# Patient Record
Sex: Female | Born: 1975 | Race: White | Hispanic: Yes | Marital: Married | Smoking: Current every day smoker
Health system: Southern US, Community
[De-identification: ages and names within clinical notes are randomized; demographics above are authoritative.]

## PROBLEM LIST (undated history)

## (undated) DIAGNOSIS — E785 Hyperlipidemia, unspecified: Secondary | ICD-10-CM

## (undated) DIAGNOSIS — F32A Depression, unspecified: Secondary | ICD-10-CM

## (undated) DIAGNOSIS — F419 Anxiety disorder, unspecified: Secondary | ICD-10-CM

## (undated) DIAGNOSIS — G500 Trigeminal neuralgia: Secondary | ICD-10-CM

## (undated) DIAGNOSIS — F329 Major depressive disorder, single episode, unspecified: Secondary | ICD-10-CM

## (undated) DIAGNOSIS — I10 Essential (primary) hypertension: Secondary | ICD-10-CM

## (undated) HISTORY — DX: Essential (primary) hypertension: I10

## (undated) HISTORY — PX: ABDOMINAL ADHESION SURGERY: SHX90

## (undated) HISTORY — DX: Anxiety disorder, unspecified: F41.9

## (undated) HISTORY — PX: TONSILLECTOMY: SUR1361

## (undated) HISTORY — PX: ABDOMINAL HYSTERECTOMY: SHX81

## (undated) HISTORY — DX: Trigeminal neuralgia: G50.0

## (undated) HISTORY — DX: Depression, unspecified: F32.A

## (undated) HISTORY — DX: Hyperlipidemia, unspecified: E78.5

---
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
## 2018-11-19 DIAGNOSIS — B0223 Postherpetic polyneuropathy: Secondary | ICD-10-CM | POA: Insufficient documentation

## 2018-11-19 DIAGNOSIS — G9332 Myalgic encephalomyelitis/chronic fatigue syndrome: Secondary | ICD-10-CM | POA: Insufficient documentation

## 2019-04-16 DIAGNOSIS — F332 Major depressive disorder, recurrent severe without psychotic features: Secondary | ICD-10-CM | POA: Insufficient documentation

## 2019-04-21 DIAGNOSIS — E559 Vitamin D deficiency, unspecified: Secondary | ICD-10-CM | POA: Insufficient documentation

## 2019-09-21 DIAGNOSIS — R42 Dizziness and giddiness: Secondary | ICD-10-CM | POA: Insufficient documentation

## 2019-09-21 DIAGNOSIS — R413 Other amnesia: Secondary | ICD-10-CM | POA: Insufficient documentation

## 2019-09-21 DIAGNOSIS — R2689 Other abnormalities of gait and mobility: Secondary | ICD-10-CM | POA: Insufficient documentation

## 2019-10-02 ENCOUNTER — Other Ambulatory Visit: Payer: Self-pay | Admitting: Acute Care

## 2019-10-02 DIAGNOSIS — R9082 White matter disease, unspecified: Secondary | ICD-10-CM

## 2019-10-16 ENCOUNTER — Ambulatory Visit: Admission: RE | Admit: 2019-10-16 | Payer: 59 | Source: Ambulatory Visit

## 2019-10-17 ENCOUNTER — Other Ambulatory Visit (INDEPENDENT_AMBULATORY_CARE_PROVIDER_SITE_OTHER): Payer: Self-pay | Admitting: Vascular Surgery

## 2019-10-17 DIAGNOSIS — R0989 Other specified symptoms and signs involving the circulatory and respiratory systems: Secondary | ICD-10-CM

## 2019-10-17 DIAGNOSIS — L819 Disorder of pigmentation, unspecified: Secondary | ICD-10-CM

## 2019-10-19 ENCOUNTER — Other Ambulatory Visit: Payer: Self-pay

## 2019-10-19 ENCOUNTER — Ambulatory Visit (INDEPENDENT_AMBULATORY_CARE_PROVIDER_SITE_OTHER): Payer: 59

## 2019-10-19 ENCOUNTER — Ambulatory Visit (INDEPENDENT_AMBULATORY_CARE_PROVIDER_SITE_OTHER): Payer: 59 | Admitting: Vascular Surgery

## 2019-10-19 ENCOUNTER — Encounter (INDEPENDENT_AMBULATORY_CARE_PROVIDER_SITE_OTHER): Payer: Self-pay | Admitting: Vascular Surgery

## 2019-10-19 DIAGNOSIS — F17200 Nicotine dependence, unspecified, uncomplicated: Secondary | ICD-10-CM | POA: Diagnosis not present

## 2019-10-19 DIAGNOSIS — M79604 Pain in right leg: Secondary | ICD-10-CM | POA: Diagnosis not present

## 2019-10-19 DIAGNOSIS — M79609 Pain in unspecified limb: Secondary | ICD-10-CM | POA: Insufficient documentation

## 2019-10-19 DIAGNOSIS — M79605 Pain in left leg: Secondary | ICD-10-CM | POA: Diagnosis not present

## 2019-10-19 DIAGNOSIS — L819 Disorder of pigmentation, unspecified: Secondary | ICD-10-CM

## 2019-10-19 DIAGNOSIS — R230 Cyanosis: Secondary | ICD-10-CM | POA: Diagnosis not present

## 2019-10-19 DIAGNOSIS — R0989 Other specified symptoms and signs involving the circulatory and respiratory systems: Secondary | ICD-10-CM

## 2019-10-19 NOTE — Assessment & Plan Note (Signed)
To assess her arterial perfusion we performed noninvasive studies today in the form of ABIs which were normal at 1.22 on the right and 1.15 on the left with triphasic waveforms although the PPG toe pressures appear slightly dampened.  I will check her venous disease as well, although I do not think vascular disease is likely the cause of her discoloration at this point.

## 2019-10-19 NOTE — Assessment & Plan Note (Signed)

## 2019-10-19 NOTE — Assessment & Plan Note (Signed)
To assess her arterial perfusion we performed noninvasive studies today in the form of ABIs which were normal at 1.22 on the right and 1.15 on the left with triphasic waveforms although the PPG toe pressures appear slightly dampened.   Source for pain is not entirely clear.  I will assess her for venous disease although her symptoms are not really typical for venous disease.  She already been checked for neuropathy which was unrevealing.  I suspect some sort of autoimmune or other disorder and she may benefit from a rheumatology evaluation.  We can provide this if her venous studies are unrevealing.

## 2019-10-19 NOTE — Patient Instructions (Signed)
Peripheral Vascular Disease  Peripheral vascular disease (PVD) is a disease of the blood vessels that are not part of your heart and brain. A simple term for PVD is poor circulation. In most cases, PVD narrows the blood vessels that carry blood from your heart to the rest of your body. This can reduce the supply of blood to your arms, legs, and internal organs, like your stomach or kidneys. However, PVD most often affects a person's lower legs and feet. Without treatment, PVD tends to get worse. PVD can also lead to acute ischemic limb. This is when an arm or leg suddenly cannot get enough blood. This is a medical emergency. Follow these instructions at home: Lifestyle  Do not use any products that contain nicotine or tobacco, such as cigarettes and e-cigarettes. If you need help quitting, ask your doctor.  Lose weight if you are overweight. Or, stay at a healthy weight as told by your doctor.  Eat a diet that is low in fat and cholesterol. If you need help, ask your doctor.  Exercise regularly. Ask your doctor for activities that are right for you. General instructions  Take over-the-counter and prescription medicines only as told by your doctor.  Take good care of your feet: ? Wear comfortable shoes that fit well. ? Check your feet often for any cuts or sores.  Keep all follow-up visits as told by your doctor This is important. Contact a doctor if:  You have cramps in your legs when you walk.  You have leg pain when you are at rest.  You have coldness in a leg or foot.  Your skin changes.  You are unable to get or have an erection (erectile dysfunction).  You have cuts or sores on your feet that do not heal. Get help right away if:  Your arm or leg turns cold, numb, and blue.  Your arms or legs become red, warm, swollen, painful, or numb.  You have chest pain.  You have trouble breathing.  You suddenly have weakness in your face, arm, or leg.  You become very  confused or you cannot speak.  You suddenly have a very bad headache.  You suddenly cannot see. Summary  Peripheral vascular disease (PVD) is a disease of the blood vessels.  A simple term for PVD is poor circulation. Without treatment, PVD tends to get worse.  Treatment may include exercise, low fat and low cholesterol diet, and quitting smoking. This information is not intended to replace advice given to you by your health care provider. Make sure you discuss any questions you have with your health care provider. Document Revised: 06/03/2017 Document Reviewed: 07/29/2016 Elsevier Patient Education  2020 [employer] Inc[employer]  

## 2019-10-19 NOTE — Progress Notes (Signed)
Patient ID: Holly Howard, female   DOB: 04-23-1976, 44 y.o.   MRN: [id number]  Chief Complaint  Patient presents with  . New Patient (Initial Visit)    ref Melrose Nakayama evaluate discoloratioin and circulation    HPI Holly Howard is a 44 y.o. female.  I am asked to see the patient by Dr. Melrose Nakayama for evaluation of lower extremity discoloration.  This happens not related to cold or warm sensations.  It is almost like a splotchiness and a purplish discoloration of the lower legs and feet.  It comes and goes without obvious cause.  It is associated with pronounced pain in her legs.  The pain goes all the way up to the hip and thigh area.  This is not necessarily related to activity.  Elevation does not help.  She does not have a lot of associated swelling.  To assess her arterial perfusion we performed noninvasive studies today in the form of ABIs which were normal at 1.22 on the right and 1.15 on the left with triphasic waveforms although the PPG toe pressures appear slightly dampened.  She says she has been checked for neuropathy and does not have any.   Past Medical History:  Diagnosis Date  . Hyperlipidemia   . Hypertension       Family History  Problem Relation Age of Onset  . Thyroid disease Mother   . Hyperlipidemia Mother   . Hypertension Mother   . Diabetes Mother      Social History   Tobacco Use  . Smoking status: Current Every Day Smoker  . Smokeless tobacco: Never Used  Substance Use Topics  . Alcohol use: Not Currently  . Drug use: Never    Allergies  Allergen Reactions  . Wasp Venom Anaphylaxis, Hives, Itching, Nausea Only, Shortness Of Breath and Swelling    Other reaction(s): Hallucinations, Headache    Current Outpatient Medications  Medication Sig Dispense Refill  . DULoxetine (CYMBALTA) 20 MG capsule Take 20 mg by mouth daily.    Marland Kitchen ibuprofen (ADVIL) 800 MG tablet Take by mouth.    . sertraline (ZOLOFT) 25 MG tablet TAKE 1 TABLET BY MOUTH ONCE  DAILY FOR 30 DAYS     No current facility-administered medications for this visit.      REVIEW OF SYSTEMS (Negative unless checked)  Constitutional: [] Weight loss  [] Fever  [] Chills Cardiac: [] Chest pain   [] Chest pressure   [] Palpitations   [] Shortness of breath when laying flat   [] Shortness of breath at rest   [] Shortness of breath with exertion. Vascular:  [] Pain in legs with walking   [] Pain in legs at rest   [] Pain in legs when laying flat   [] Claudication   [] Pain in feet when walking  [] Pain in feet at rest  [] Pain in feet when laying flat   [] History of DVT   [] Phlebitis   [] Swelling in legs   [] Varicose veins   [] Non-healing ulcers Pulmonary:   [] Uses home oxygen   [] Productive cough   [] Hemoptysis   [] Wheeze  [] COPD   [] Asthma Neurologic:  [] Dizziness  [] Blackouts   [] Seizures   [] History of stroke   [] History of TIA  [] Aphasia   [] Temporary blindness   [] Dysphagia   [] Weakness or numbness in arms   [] Weakness or numbness in legs Musculoskeletal:  [] Arthritis   [] Joint swelling   [] Joint pain   [] Low back pain Hematologic:  [] Easy bruising  [] Easy bleeding   [] Hypercoagulable state   [] Anemic  [] Hepatitis  Gastrointestinal:  [] Blood in stool   [] Vomiting blood  [] Gastroesophageal reflux/heartburn   [] Abdominal pain Genitourinary:  [] Chronic kidney disease   [] Difficult urination  [] Frequent urination  [] Burning with urination   [] Hematuria Skin:  [] Rashes   [] Ulcers   [] Wounds Psychological:  [] History of anxiety   [x]  History of major depression.    Physical Exam BP 116/75 (BP Location: Right Arm)   Pulse 90   Resp 16   Wt 159 lb 9.6 oz (72.4 kg)  Gen:  WD/WN, NAD Head: [place]/AT, No temporalis wasting. Ear/Nose/Throat: Hearing grossly intact, nares w/o erythema or drainage, oropharynx w/o Erythema/Exudate Eyes: Conjunctiva clear, sclera non-icteric  Neck: trachea midline.  No JVD.  Pulmonary:  Good air movement, respirations not labored, no use of accessory muscles  Cardiac:  RRR, no JVD Vascular:  Vessel Right Left  Radial Palpable Palpable                          DP 2+ 2+  PT 2+ 2+   Gastrointestinal:. No masses, surgical incisions, or scars. Musculoskeletal: M/S 5/5 throughout.  Extremities without ischemic changes.  No deformity or atrophy. No edema. Neurologic: Sensation grossly intact in extremities.  Symmetrical.  Speech is fluent. Motor exam as listed above. Psychiatric: Judgment intact, Mood & affect appropriate for pt's clinical situation. Dermatologic: No rashes or ulcers noted.  No cellulitis or open wounds.    Radiology No results found.  Labs No results found for this or any previous visit (from the past 2160 hour(s)).  Assessment/Plan:  Tobacco use disorder We had a discussion for approximately 3 minutes regarding the absolute need for smoking cessation due to the deleterious nature of tobacco on the vascular system. We discussed the tobacco use would diminish patency of any intervention, and likely significantly worsen progressio of disease. We discussed multiple agents for quitting including replacement therapy or medications to reduce cravings such as Chantix. The patient voices their understanding of the importance of smoking cessation.   Pain in limb To assess her arterial perfusion we performed noninvasive studies today in the form of ABIs which were normal at 1.22 on the right and 1.15 on the left with triphasic waveforms although the PPG toe pressures appear slightly dampened.   Source for pain is not entirely clear.  I will assess her for venous disease although her symptoms are not really typical for venous disease.  She already been checked for neuropathy which was unrevealing.  I suspect some sort of autoimmune or other disorder and she may benefit from a rheumatology evaluation.  We can provide this if her venous studies are unrevealing.  Peripheral cyanosis To assess her arterial perfusion we performed noninvasive  studies today in the form of ABIs which were normal at 1.22 on the right and 1.15 on the left with triphasic waveforms although the PPG toe pressures appear slightly dampened.  I will check her venous disease as well, although I do not think vascular disease is likely the cause of her discoloration at this point.      10/19/2019, 12:07 PM   This note was created with Dragon medical transcription system.  Any errors from dictation are unintentional.

## 2019-10-22 DIAGNOSIS — R200 Anesthesia of skin: Secondary | ICD-10-CM | POA: Insufficient documentation

## 2019-10-23 ENCOUNTER — Other Ambulatory Visit: Payer: Self-pay

## 2019-10-23 ENCOUNTER — Ambulatory Visit (HOSPITAL_COMMUNITY): Payer: Self-pay | Admitting: Licensed Clinical Social Worker

## 2019-10-24 ENCOUNTER — Ambulatory Visit (INDEPENDENT_AMBULATORY_CARE_PROVIDER_SITE_OTHER): Payer: 59 | Admitting: Licensed Clinical Social Worker

## 2019-10-24 ENCOUNTER — Other Ambulatory Visit: Payer: Self-pay

## 2019-10-24 ENCOUNTER — Encounter (HOSPITAL_COMMUNITY): Payer: Self-pay | Admitting: Licensed Clinical Social Worker

## 2019-10-24 DIAGNOSIS — F331 Major depressive disorder, recurrent, moderate: Secondary | ICD-10-CM | POA: Diagnosis not present

## 2019-10-24 DIAGNOSIS — F411 Generalized anxiety disorder: Secondary | ICD-10-CM | POA: Diagnosis not present

## 2019-10-24 DIAGNOSIS — F410 Panic disorder [episodic paroxysmal anxiety] without agoraphobia: Secondary | ICD-10-CM
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
## 2019-10-29 DIAGNOSIS — R202 Paresthesia of skin: Secondary | ICD-10-CM | POA: Insufficient documentation

## 2019-11-01 ENCOUNTER — Ambulatory Visit (INDEPENDENT_AMBULATORY_CARE_PROVIDER_SITE_OTHER): Payer: 59 | Admitting: Licensed Clinical Social Worker

## 2019-11-01 ENCOUNTER — Encounter (HOSPITAL_COMMUNITY): Payer: Self-pay | Admitting: Licensed Clinical Social Worker

## 2019-11-01 ENCOUNTER — Other Ambulatory Visit: Payer: Self-pay

## 2019-11-01 DIAGNOSIS — G500 Trigeminal neuralgia: Secondary | ICD-10-CM | POA: Diagnosis not present

## 2019-11-01 DIAGNOSIS — F410 Panic disorder [episodic paroxysmal anxiety] without agoraphobia: Secondary | ICD-10-CM | POA: Diagnosis not present

## 2019-11-01 DIAGNOSIS — F411 Generalized anxiety disorder: Secondary | ICD-10-CM

## 2019-11-01 DIAGNOSIS — F329 Major depressive disorder, single episode, unspecified: Secondary | ICD-10-CM | POA: Diagnosis not present

## 2019-11-01 DIAGNOSIS — M797 Fibromyalgia: Secondary | ICD-10-CM

## 2019-11-01 DIAGNOSIS — F064 Anxiety disorder due to known physiological condition: Secondary | ICD-10-CM | POA: Diagnosis not present

## 2019-11-01 DIAGNOSIS — G8929 Other chronic pain: Secondary | ICD-10-CM

## 2019-11-01 DIAGNOSIS — F331 Major depressive disorder, recurrent, moderate: Secondary | ICD-10-CM

## 2019-11-01 DIAGNOSIS — M549 Dorsalgia, unspecified: Secondary | ICD-10-CM

## 2019-11-01 NOTE — Progress Notes (Signed)
Virtual Visit via Video Note  I connected with Holly Howard on 11/01/19 at 10:00 AM EDT by a video enabled telemedicine application and verified that I am speaking with the correct person using two identifiers.   I discussed the limitations of evaluation and management by telemedicine and the availability of in person appointments. The patient expressed understanding and agreed to proceed.  History of Present Illness: Patient is referred to therapy by her PCP because of panic attacks, depression and anxiety due to her health conditions (trigeminal neuroglia, white matter disease, fibromyalgia) and the loss of her dog due to poisoning. Patient began having panic attacks due to her chronic pain which has led to depression and anxiety. Daughter, her husband, 2 grandkids (2, 4), live with her. Her son also lives on the property.     Observations/Objective: Patient presented anxious for her initial counseling session. Patient discussed her psychiatric symptoms and current life events. Spent a considerable amount of time building a trusting therapeutic relationship and gaining background information. Discussed coping skills and emailed patient grounding, relaxation and mindfulness skills.  PLAN: Review grounding, relaxation, mindfulness skills   Assessment and Plan: : Counselor will continue to meet with patient to address treatment plan goals. Patient will continue to follow recommendations of providers and implement skills learned in session.   Follow Up Instructions: I discussed the assessment and treatment plan with the patient. The patient was provided an opportunity to ask questions and all were answered. The patient agreed with the plan and demonstrated an understanding of the instructions.   The patient was advised to call back or seek an in-person evaluation if the symptoms worsen or if the condition fails to improve as anticipated.  I provided 45 minutes of non-face-to-face time during  this encounter.   Loyal Holzheimer S, LCAS

## 2019-11-05 ENCOUNTER — Ambulatory Visit (HOSPITAL_COMMUNITY): Payer: Medicaid Other | Admitting: Licensed Clinical Social Worker

## 2019-11-07 ENCOUNTER — Other Ambulatory Visit: Payer: Self-pay

## 2019-11-07 ENCOUNTER — Encounter (HOSPITAL_COMMUNITY): Payer: Self-pay | Admitting: Licensed Clinical Social Worker

## 2019-11-07 ENCOUNTER — Ambulatory Visit (INDEPENDENT_AMBULATORY_CARE_PROVIDER_SITE_OTHER): Payer: 59 | Admitting: Licensed Clinical Social Worker

## 2019-11-07 DIAGNOSIS — F410 Panic disorder [episodic paroxysmal anxiety] without agoraphobia: Secondary | ICD-10-CM

## 2019-11-07 DIAGNOSIS — F411 Generalized anxiety disorder: Secondary | ICD-10-CM

## 2019-11-07 DIAGNOSIS — F331 Major depressive disorder, recurrent, moderate: Secondary | ICD-10-CM | POA: Diagnosis not present

## 2019-11-07 NOTE — Progress Notes (Signed)
Virtual Visit via Video Note  I connected with Holly Howard on 11/07/19 at  9:00 AM EDT by a video enabled telemedicine application and verified that I am speaking with the correct person using two identifiers.   I discussed the limitations of evaluation and management by telemedicine and the availability of in person appointments. The patient expressed understanding and agreed to proceed.  History of Present Illness: Patient is referred to therapy by her PCP because of panic attacks, depression and anxiety due to her health conditions (trigeminal neuroglia, white matter disease, fibromyalgia) and the loss of her dog due to poisoning. Patient began having panic attacks due to her chronic pain which has led to depression and anxiety. Daughter, her husband, 2 grandkids (2, 4), live with her. Her son also lives on the property.     Observations/Objective: Patient presented in extreme pain and crying for her counseling session. Patient discussed her psychiatric symptoms and current life events. Patient reports she has been in the bed in extreme pain since last session. Asked open ended questions and validated her feelings. Patient discussed her feelings of helplessness and hopelessness to pain management. No S/I or H/I. Suggested guided imagery on Youtube for pain.    PLAN: Review grounding, relaxation, mindfulness skills   Assessment and Plan: : Counselor will continue to meet with patient to address treatment plan goals. Patient will continue to follow recommendations of providers and implement skills learned in session.   Follow Up Instructions: I discussed the assessment and treatment plan with the patient. The patient was provided an opportunity to ask questions and all were answered. The patient agreed with the plan and demonstrated an understanding of the instructions.   The patient was advised to call back or seek an in-person evaluation if the symptoms worsen or if the condition fails to  improve as anticipated.  I provided 30 minutes of non-face-to-face time during this encounter.   Axiel Fjeld S, LCAS

## 2019-11-13 ENCOUNTER — Ambulatory Visit (INDEPENDENT_AMBULATORY_CARE_PROVIDER_SITE_OTHER): Payer: 59

## 2019-11-13 ENCOUNTER — Telehealth (HOSPITAL_COMMUNITY): Payer: Self-pay | Admitting: Licensed Clinical Social Worker

## 2019-11-13 ENCOUNTER — Ambulatory Visit (INDEPENDENT_AMBULATORY_CARE_PROVIDER_SITE_OTHER): Payer: 59 | Admitting: Nurse Practitioner

## 2019-11-13 ENCOUNTER — Other Ambulatory Visit: Payer: Self-pay

## 2019-11-13 ENCOUNTER — Ambulatory Visit (HOSPITAL_COMMUNITY): Payer: 59 | Admitting: Licensed Clinical Social Worker

## 2019-11-13 NOTE — Telephone Encounter (Signed)
Patient did not present for virtual therapy session. Emailed patient reminder. Called patient and left message on vm. Patient did not join session. Hosanna Betley, LCAS

## 2019-11-14 ENCOUNTER — Other Ambulatory Visit: Payer: Self-pay

## 2019-11-14 ENCOUNTER — Encounter (HOSPITAL_COMMUNITY): Payer: Self-pay | Admitting: Psychiatry

## 2019-11-14 ENCOUNTER — Telehealth (INDEPENDENT_AMBULATORY_CARE_PROVIDER_SITE_OTHER): Payer: 59 | Admitting: Psychiatry

## 2019-11-14 DIAGNOSIS — F331 Major depressive disorder, recurrent, moderate: Secondary | ICD-10-CM
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
## 2019-11-20 ENCOUNTER — Encounter (INDEPENDENT_AMBULATORY_CARE_PROVIDER_SITE_OTHER): Payer: Self-pay

## 2019-11-20 ENCOUNTER — Ambulatory Visit
Admission: RE | Admit: 2019-11-20 | Discharge: 2019-11-20 | Disposition: A | Discharge status: Home / Self Care | Payer: 59 | Source: Ambulatory Visit | Attending: Acute Care | Admitting: Acute Care

## 2019-11-20 ENCOUNTER — Other Ambulatory Visit: Payer: Self-pay

## 2019-11-20 ENCOUNTER — Encounter: Payer: Self-pay | Admitting: Student in an Organized Health Care Education/Training Program

## 2019-11-20 ENCOUNTER — Ambulatory Visit (HOSPITAL_BASED_OUTPATIENT_CLINIC_OR_DEPARTMENT_OTHER): Payer: 59 | Admitting: Student in an Organized Health Care Education/Training Program

## 2019-11-20 VITALS — BP 117/73 | HR 97 | Temp 97.3°F | Ht 59.0 in | Wt 160.0 lb

## 2019-11-20 DIAGNOSIS — R9082 White matter disease, unspecified: Secondary | ICD-10-CM | POA: Diagnosis present

## 2019-11-20 DIAGNOSIS — M549 Dorsalgia, unspecified: Secondary | ICD-10-CM | POA: Insufficient documentation

## 2019-11-20 DIAGNOSIS — G894 Chronic pain syndrome: Secondary | ICD-10-CM | POA: Insufficient documentation

## 2019-11-20 DIAGNOSIS — F1290 Cannabis use, unspecified, uncomplicated: Secondary | ICD-10-CM | POA: Insufficient documentation

## 2019-11-20 DIAGNOSIS — M26629 Arthralgia of temporomandibular joint, unspecified side: Secondary | ICD-10-CM | POA: Insufficient documentation

## 2019-11-20 DIAGNOSIS — M797 Fibromyalgia: Secondary | ICD-10-CM | POA: Insufficient documentation

## 2019-11-20 DIAGNOSIS — G500 Trigeminal neuralgia: Secondary | ICD-10-CM

## 2019-11-20 NOTE — Progress Notes (Signed)
Patient: Holly Howard  Service Category: E/M  Provider: Gillis Santa, MD  DOB: [birth year]  DOS: 11/20/2019  Referring Provider: Laverle Hobby, NP  MRN: [id number]  Setting: Ambulatory outpatient  PCP: Laverle Hobby, NP  Type: New Patient  Specialty: Interventional Pain Management    Location: Office  Delivery: Face-to-face     Primary Reason(s) for Visit: Encounter for initial evaluation of one or more chronic problems (new to examiner) potentially causing chronic pain, and posing a threat to normal musculoskeletal function. (Level of risk: High) CC: Generalized Body Aches  HPI  Holly Howard is a 44 y.o. year old, female patient, who comes today to see [place] for the first time for an initial evaluation of her chronic pain. She has Tobacco use disorder; Pain in limb; Peripheral cyanosis; MDD (major depressive disorder), recurrent episode, moderate ([place]); Chronic pain syndrome; Trigeminal neuralgia; Temporomandibular joint-pain-dysfunction syndrome (TMJ); Musculoskeletal back pain; Marijuana use; and Fibromyalgia on their problem list. Today she comes in for evaluation of her Generalized Body Aches  Pain Assessment: Location:   Generalized Radiating: Pain is shooting everywhere Onset: More than a month ago Duration: Chronic pain Quality: Constant, Shooting Severity: 6 /10 (subjective, self-reported pain score)  Note: Reported level is compatible with observation.                         When using our objective Pain Scale, levels between 6 and 10/10 are said to belong in an emergency room, as it progressively worsens from a 6/10, described as severely limiting, requiring emergency care not usually available at an outpatient pain management facility. At a 6/10 level, communication becomes difficult and requires great effort. Assistance to reach the emergency department may be required. Facial flushing and profuse sweating along with potentially dangerous increases in heart rate and blood pressure  will be evident. Effect on ADL: limits my daily activities Timing: Constant Modifying factors: nothing BP: 117/73  HR: 97  Onset and Duration: Gradual Cause of pain: Unknown Severity: Getting worse, NAS-11 at its worse: 10/10, NAS-11 at its best: 4/10, NAS-11 now: 6/10 and NAS-11 on the average: 8/10 Timing: Morning, Night, After activity or exercise and After a period of immobility Aggravating Factors: Climbing, Kneeling, Lifiting, Motion, Prolonged sitting and Walking uphill Alleviating Factors: Cold packs, Hot packs and Medications Associated Problems: Color changes, Constipation, Depression, Dizziness, Fatigue, Inability to concentrate, Inability to control bladder (urine), Nausea, Numbness, Personality changes, Sadness, Spasms, Sweating, Swelling, Tingling, Weakness and Pain that does not allow patient to sleep Quality of Pain: Aching, Agonizing, Cruel, Disabling, Dreadful, Exhausting, Feeling of weight, Heavy, Horrible, Punishing, Sharp, Shooting, Stabbing, Tender, Tingling and Uncomfortable Previous Examinations or Tests: Endoscopy, MRI scan, Nerve conduction test, Neurological evaluation and Psychiatric evaluation Previous Treatments: Steroid treatments by mouth  The patient comes into the clinics today for the first time for a chronic pain management evaluation.   Holly Howard is a pleasant 44 year old female who presents with a chief complaint of diffuse whole body musculoskeletal pain.  She states that this is been present for many years.  She was previously seeing a pain clinic in [place], [place].  She describes severe "bone pain".  She states that she has trouble walking at times especially in the evening due to her pain.  She states in [place], she had MRIs of her lumbar spine done which were unremarkable.  She does endorse difficulty concentrating and staying on task.  She has dealt with serotonin syndrome in the past  due to tramadol, Zoloft intake.  She is currently  off tramadol.  She is on low-dose Cymbalta at 20 mg which she states is beneficial for her pain.  She is also on Zoloft 25 mg.  She takes baclofen 5 mg nightly.  She wonders if she has fibromyalgia.  She also utilizes marijuana.  She states that it helps her sleep.  She is currently not on any chronic opioid therapy.  She does have significant depression for which she sees a psychiatrist for.  Historic Controlled Substance Pharmacotherapy Review   Historical Monitoring: The patient reports use of THC  List of all UDS Test(s): No results found for: MDMA, COCAINSCRNUR, Manuel Howard, [place], CANNABQUANT, [place], [place] List of other Serum/Urine Drug Screening Test(s):  No results found for: AMPHSCRSER, BARBSCRSER, BENZOSCRSER, COCAINSCRSER, COCAINSCRNUR, PCPSCRSER, PCPQUANT, THCSCRSER, THCU, CANNABQUANT, OPIATESCRSER, OXYSCRSER, PROPOXSCRSER, ETH Historical Background Evaluation: Holly Howard PMP: PDMP not reviewed this encounter. Six (6) year initial data search conducted.             [place] Department of public safety, offender search: Editor, commissioning Information) Non-contributory Risk Assessment Profile: Aberrant behavior: None observed or detected today Risk factors for fatal opioid overdose: age 70-30 years old and history of substance abuse Fatal overdose hazard ratio (HR): Calculation deferred Non-fatal overdose hazard ratio (HR): Calculation deferred Risk of opioid abuse or dependence: 0.7-3.0% with doses ? 36 MME/day and 6.1-26% with doses ? 120 MME/day. Substance use disorder (SUD) risk level: See below Personal History of Substance Abuse (SUD-Substance use disorder):  Alcohol: Negative  Illegal Drugs: Negative  Rx Drugs: Negative  ORT Risk Level calculation: Moderate Risk Opioid Risk Tool - 11/20/19 0947      Family History of Substance Abuse   Alcohol  Negative    Illegal Drugs  Positive Female    Rx Drugs  Negative      Personal History of Substance Abuse   Alcohol  Negative    Illegal Drugs  Negative     Rx Drugs  Negative      Age   Age between 56-45 years   Yes      History of Preadolescent Sexual Abuse   History of Preadolescent Sexual Abuse  Negative or Female      Psychological Disease   Psychological Disease  Positive    ADD  Negative    OCD  Negative    Bipolar  Negative    Schizophrenia  Negative    Depression  Positive      Total Score   Opioid Risk Tool Scoring  7    Opioid Risk Interpretation  Moderate Risk      ORT Scoring interpretation table:  Score <3 = Low Risk for SUD  Score between 4-7 = Moderate Risk for SUD  Score >8 = High Risk for Opioid Abuse   PHQ-2 Depression Scale:  Total score:    PHQ-2 Scoring interpretation table: (Score and probability of major depressive disorder)  Score 0 = No depression  Score 1 = 15.4% Probability  Score 2 = 21.1% Probability  Score 3 = 38.4% Probability  Score 4 = 45.5% Probability  Score 5 = 56.4% Probability  Score 6 = 78.6% Probability   PHQ-9 Depression Scale:  Total score:    PHQ-9 Scoring interpretation table:  Score 0-4 = No depression  Score 5-9 = Mild depression  Score 10-14 = Moderate depression  Score 15-19 = Moderately severe depression  Score 20-27 = Severe depression (2.4 times higher risk of SUD and 2.89 times  higher risk of overuse)   Pharmacologic Plan: As per protocol, I have not taken over any controlled substance management, pending the results of ordered tests and/or consults.            Initial impression: Pending review of available data and ordered tests.  Meds   Current Outpatient Medications:  .  Ascorbic Acid (VITAMIN C) 100 MG tablet, Take 100 mg by mouth daily., Disp: , Rfl:  .  baclofen (LIORESAL) 10 MG tablet, Take 5 mg by mouth at bedtime., Disp: , Rfl:  .  cholecalciferol (VITAMIN D3) 25 MCG (1000 UNIT) tablet, Take 1,000 Units by mouth daily., Disp: , Rfl:  .  DULoxetine (CYMBALTA) 20 MG capsule, Take 20 mg by mouth daily., Disp: , Rfl:  .  hydrochlorothiazide (HYDRODIURIL)  12.5 MG tablet, Take 12.5 mg by mouth as needed., Disp: , Rfl:  .  ibuprofen (ADVIL) 800 MG tablet, Take by mouth., Disp: , Rfl:  .  LORazepam (ATIVAN) 1 MG tablet, Take 1 mg by mouth as needed for anxiety., Disp: , Rfl:  .  meclizine (ANTIVERT) 25 MG tablet, Take 25 mg by mouth as needed for dizziness., Disp: , Rfl:  .  ondansetron (ZOFRAN) 24 MG tablet, Take 24 mg by mouth as needed for nausea., Disp: , Rfl:  .  sertraline (ZOLOFT) 25 MG tablet, TAKE 1 TABLET BY MOUTH ONCE DAILY FOR 30 DAYS, Disp: , Rfl:  .  traZODone (DESYREL) 50 MG tablet, Take 25 mg by mouth at bedtime as needed for sleep., Disp: , Rfl:    ROS  Cardiovascular: No reported cardiovascular signs or symptoms such as High blood pressure, coronary artery disease, abnormal heart rate or rhythm, heart attack, blood thinner therapy or heart weakness and/or failure Pulmonary or Respiratory: Smoking Neurological: Abnormal skin sensations (Peripheral Neuropathy) Psychological-Psychiatric: Psychiatric disorder, Anxiousness, Depressed, Prone to panicking and Attempted suicide Gastrointestinal: Alternating episodes iof diarrhea and constipation (IBS-Irritable bowe syndrome) and Irregular, infrequent bowel movements (Constipation) Genitourinary: No reported renal or genitourinary signs or symptoms such as difficulty voiding or producing urine, peeing blood, non-functioning kidney, kidney stones, difficulty emptying the bladder, difficulty controlling the flow of urine, or chronic kidney disease Hematological: Weakness due to low blood hemoglobin or red blood cell count (Anemia) and Brusing easily Endocrine: No reported endocrine signs or symptoms such as high or low blood sugar, rapid heart rate due to high thyroid levels, obesity or weight gain due to slow thyroid or thyroid disease Rheumatologic: Generalized muscle aches (Fibromyalgia) and Constant unexplained fatigue (Chronic Fatigue Syndrome) Musculoskeletal: Negative for myasthenia  gravis, muscular dystrophy, multiple sclerosis or malignant hyperthermia Work History: Quit going to work on his/her own  Allergies  Ms. Paolillo is allergic to wasp venom.  Laboratory Chemistry Profile   Renal No results found for: BUN, CREATININE, LABCREA, BCR, GFR, GFRAA, GFRNONAA, SPECGRAV, PHUR, PROTEINUR   Electrolytes No results found for: NA, K, CL, CALCIUM, MG, PHOS   Hepatic No results found for: AST, ALT, ALBUMIN, ALKPHOS, AMYLASE, LIPASE, AMMONIA   ID No results found for: LYMEIGGIGMAB, HIV, SARSCOV2NAA, STAPHAUREUS, MRSAPCR, HCVAB, PREGTESTUR, RMSFIGG, QFVRPH1IGG, QFVRPH2IGG, LYMEIGGIGMAB   Bone No results found for: VD25OH, [id number], KG2542HC6, CB7628BT5, 25OHVITD1, 25OHVITD2, 25OHVITD3, TESTOFREE, TESTOSTERONE   Endocrine No results found for: GLUCOSE, GLUCOSEU, HGBA1C, TSH, FREET4, TESTOFREE, TESTOSTERONE, SHBG, ESTRADIOL, ESTRADIOLPCT, ESTRADIOLFRE, LABPREG, ACTH, CRTSLPL, UCORFRPERLTR, UCORFRPERDAY, CORTISOLBASE, LABPREG   Neuropathy No results found for: VITAMINB12, FOLATE, HGBA1C, HIV   CNS No results found for: COLORCSF, APPEARCSF, RBCCOUNTCSF, WBCCSF, POLYSCSF, LYMPHSCSF, EOSCSF, PROTEINCSF,  GLUCCSF, JCVIRUS, CSFOLI, IGGCSF, LABACHR, ACETBL, LABACHR, ACETBL   Inflammation (CRP: Acute  ESR: Chronic) No results found for: CRP, ESRSEDRATE, LATICACIDVEN   Rheumatology No results found for: RF, ANA, LABURIC, URICUR, LYMEIGGIGMAB, LYMEABIGMQN, HLAB27   Coagulation No results found for: INR, LABPROT, APTT, PLT, DDIMER, LABHEMA, VITAMINK1, AT3   Cardiovascular No results found for: BNP, CKTOTAL, CKMB, TROPONINI, HGB, HCT, LABVMA, EPIRU, EPINEPH24HUR, NOREPRU, NOREPI24HUR, DOPARU, DOPAM24HRUR   Screening No results found for: SARSCOV2NAA, COVIDSOURCE, STAPHAUREUS, MRSAPCR, HCVAB, HIV, PREGTESTUR   Cancer No results found for: CEA, CA125, LABCA2   Allergens No results found for: ALMOND, APPLE, ASPARAGUS, AVOCADO, BANANA, BARLEY, BASIL, BAYLEAF, GREENBEAN,  LIMABEAN, WHITEBEAN, BEEFIGE, REDBEET, BLUEBERRY, BROCCOLI, CABBAGE, MELON, CARROT, CASEIN, CASHEWNUT, CAULIFLOWER, CELERY     Note: Lab results reviewed.   [place]  Drug: Ms. Musolino  reports no history of drug use. Alcohol:  reports previous alcohol use. Tobacco:  reports that she has been smoking. She has never used smokeless tobacco. Medical:  has a past medical history of Anxiety, Depression, Hyperlipidemia, Hypertension, and Trigeminal neuralgia. Family: family history includes Diabetes in her mother; Hyperlipidemia in her mother; Hypertension in her mother; Thyroid disease in her mother.  Past Surgical History:  Procedure Laterality Date  . ABDOMINAL ADHESION SURGERY    . ABDOMINAL HYSTERECTOMY    . CESAREAN SECTION    . TONSILLECTOMY     Active Ambulatory Problems    Diagnosis Date Noted  . Tobacco use disorder 10/19/2019  . Pain in limb 10/19/2019  . Peripheral cyanosis 10/19/2019  . MDD (major depressive disorder), recurrent episode, moderate ([place]) 10/24/2019  . Chronic pain syndrome 11/20/2019  . Trigeminal neuralgia 11/20/2019  . Temporomandibular joint-pain-dysfunction syndrome (TMJ) 11/20/2019  . Musculoskeletal back pain 11/20/2019  . Marijuana use 11/20/2019  . Fibromyalgia 11/20/2019   Resolved Ambulatory Problems    Diagnosis Date Noted  . No Resolved Ambulatory Problems   Past Medical History:  Diagnosis Date  . Anxiety   . Depression   . Hyperlipidemia   . Hypertension    Constitutional Exam  General appearance: Well nourished, well developed, and well hydrated. In no apparent acute distress Vitals:   11/20/19 0928  BP: 117/73  Pulse: 97  Temp: (!) 97.3 F (36.3 C)  SpO2: 100%  Weight: 160 lb (72.6 kg)  Height: 4' 11"  (1.499 m)   BMI Assessment: Estimated body mass index is 32.32 kg/m as calculated from the following:   Height as of this encounter: 4' 11"  (1.499 m).   Weight as of this encounter: 160 lb (72.6 kg).  BMI interpretation  table: BMI level Category Range association with higher incidence of chronic pain  <18 kg/m2 Underweight   18.5-24.9 kg/m2 Ideal body weight   25-29.9 kg/m2 Overweight Increased incidence by 20%  30-34.9 kg/m2 Obese (Class I) Increased incidence by 68%  35-39.9 kg/m2 Severe obesity (Class II) Increased incidence by 136%  >40 kg/m2 Extreme obesity (Class III) Increased incidence by 254%   Patient's current BMI Ideal Body weight  Body mass index is 32.32 kg/m. Patient must be at least 60 in tall to calculate ideal body weight   BMI Readings from Last 4 Encounters:  11/20/19 32.32 kg/m   Wt Readings from Last 4 Encounters:  11/20/19 160 lb (72.6 kg)  10/19/19 159 lb 9.6 oz (72.4 kg)    Psych/Mental status: Alert, oriented x 3 (person, place, & time)       Eyes: PERLA Respiratory: No evidence of acute respiratory distress  Cervical Spine  Exam  Skin & Axial Inspection: No masses, redness, edema, swelling, or associated skin lesions Alignment: Symmetrical Functional ROM: Unrestricted ROM      Stability: No instability detected Muscle Tone/Strength: Functionally intact. No obvious neuro-muscular anomalies detected. Sensory (Neurological): Musculoskeletal pain pattern Palpation: No palpable anomalies              Upper Extremity (UE) Exam    Side: Right upper extremity  Side: Left upper extremity  Skin & Extremity Inspection: Skin color, temperature, and hair growth are WNL. No peripheral edema or cyanosis. No masses, redness, swelling, asymmetry, or associated skin lesions. No contractures.  Skin & Extremity Inspection: Skin color, temperature, and hair growth are WNL. No peripheral edema or cyanosis. No masses, redness, swelling, asymmetry, or associated skin lesions. No contractures.  Functional ROM: Unrestricted ROM          Functional ROM: Unrestricted ROM          Muscle Tone/Strength: Functionally intact. No obvious neuro-muscular anomalies detected.  Muscle Tone/Strength:  Functionally intact. No obvious neuro-muscular anomalies detected.  Sensory (Neurological): Unimpaired          Sensory (Neurological): Unimpaired          Palpation: No palpable anomalies              Palpation: No palpable anomalies              Provocative Test(s):  Phalen's test: deferred Tinel's test: deferred Apley's scratch test (touch opposite shoulder):  Action 1 (Across chest): deferred Action 2 (Overhead): deferred Action 3 (LB reach): deferred   Provocative Test(s):  Phalen's test: deferred Tinel's test: deferred Apley's scratch test (touch opposite shoulder):  Action 1 (Across chest): deferred Action 2 (Overhead): deferred Action 3 (LB reach): deferred    Thoracic Spine Area Exam  Skin & Axial Inspection: No masses, redness, or swelling Alignment: Symmetrical Functional ROM: Unrestricted ROM Stability: No instability detected Muscle Tone/Strength: Functionally intact. No obvious neuro-muscular anomalies detected. Sensory (Neurological): Musculoskeletal pain pattern Muscle strength & Tone: No palpable anomalies  Lumbar Exam  Skin & Axial Inspection: No masses, redness, or swelling Alignment: Symmetrical Functional ROM: Unrestricted ROM       Stability: No instability detected Muscle Tone/Strength: Functionally intact. No obvious neuro-muscular anomalies detected. Sensory (Neurological): Musculoskeletal pain pattern Palpation: No palpable anomalies       Provocative Tests: Hyperextension/rotation test: (+) bilaterally for facet joint pain. Lumbar quadrant test (Kemp's test): deferred today       Lateral bending test: deferred today       Patrick's Maneuver: deferred today                   FABER* test: deferred today                   S-I anterior distraction/compression test: deferred today         S-I lateral compression test: deferred today         S-I Thigh-thrust test: deferred today         S-I Gaenslen's test: deferred today         *(Flexion, ABduction  and External Rotation)  Gait & Posture Assessment  Ambulation: Unassisted Gait: Relatively normal for age and body habitus Posture: WNL   Lower Extremity Exam    Side: Right lower extremity  Side: Left lower extremity  Stability: No instability observed          Stability: No instability observed  Skin & Extremity Inspection: Skin color, temperature, and hair growth are WNL. No peripheral edema or cyanosis. No masses, redness, swelling, asymmetry, or associated skin lesions. No contractures.  Skin & Extremity Inspection: Skin color, temperature, and hair growth are WNL. No peripheral edema or cyanosis. No masses, redness, swelling, asymmetry, or associated skin lesions. No contractures.  Functional ROM: Unrestricted ROM                  Functional ROM: Unrestricted ROM                  Muscle Tone/Strength: Functionally intact. No obvious neuro-muscular anomalies detected.  Muscle Tone/Strength: Functionally intact. No obvious neuro-muscular anomalies detected.  Sensory (Neurological): Musculoskeletal pain pattern        Sensory (Neurological): Musculoskeletal pain pattern        DTR: Patellar: deferred today Achilles: deferred today Plantar: deferred today  DTR: Patellar: deferred today Achilles: deferred today Plantar: deferred today  Palpation: No palpable anomalies  Palpation: No palpable anomalies   Assessment  Primary Diagnosis & Pertinent Problem List: The primary encounter diagnosis was Chronic pain syndrome. Diagnoses of Trigeminal neuralgia, Temporomandibular joint-pain-dysfunction syndrome (TMJ), Musculoskeletal back pain, Marijuana use, and Fibromyalgia were also pertinent to this visit.  Visit Diagnosis (New problems to examiner): 1. Chronic pain syndrome   2. Trigeminal neuralgia   3. Temporomandibular joint-pain-dysfunction syndrome (TMJ)   4. Musculoskeletal back pain   5. Marijuana use   6. Fibromyalgia    Plan of Care (Initial workup plan)  Note: Ms.  Niese was reminded that as per protocol, today's visit has been an evaluation only. We have not taken over the patient's controlled substance management.  General Recommendations: The pain condition that the patient suffers from is best treated with a multidisciplinary approach that involves an increase in physical activity to prevent de-conditioning and worsening of the pain cycle, as well as psychological counseling (formal and/or informal) to address the co-morbid psychological affects of pain. Treatment will often involve judicious use of pain medications and interventional procedures to decrease the pain, allowing the patient to participate in the physical activity that will ultimately produce long-lasting pain reductions. The goal of the multidisciplinary approach is to return the patient to a higher level of overall function and to restore their ability to perform activities of daily living.  44 year old female with diffuse whole body musculoskeletal pain along with a history of trigeminal neuralgia, TMJ dysfunction, musculoskeletal back pain and possibly fibromyalgia.  We discussed evidence-based strategies for fibromyalgia management including physical activity, aquatic therapy, reduction in red meat, reduction in dairy foods.  From a pharmacotherapy standpoint, patient has failed gabapentin which resulted in significant cognitive and psychiatric side effects.  Lyrica was not effective.  She is currently on Cymbalta at 20 mg which is dose limited by side effects of serotonin syndrome.  She is also on Zoloft.  She sees Dr. Montel Culver for depression management.  In regards to medication management, I recommend that we avoid direct acting opioid agonists including controlled 2 opioid analgesics.  Can consider buprenorphine therapy via [place] or [place].  Informed patient that she will need to discontinue her marijuana consumption if she would like to be considered for buprenorphine therapy.  She states  that she will return to provide a clean urine specimen as she did utilize marijuana approximately 4 days ago.  She states that she will discontinue her marijuana intake.   Lab Orders     Compliance Drug Analysis, Ur  Pharmacological management  options:  Opioid Analgesics: The patient was informed that there is no guarantee that she would be a candidate for opioid analgesics. The decision will be made following CDC guidelines. This decision will be based on the results of diagnostic studies, as well as Ms. Goecke's risk profile.  Consider buprenorphine therapy  Membrane stabilizer: Gabapentin caused severe cognitive side effects.  Lyrica was not helpful.  Is currently on Cymbalta 20 mg.  Muscle relaxant: To be determined at a later time  NSAID: To be determined at a later time  Other analgesic(s): To be determined at a later time    Provider-requested follow-up: Return if symptoms worsen or fail to improve.  Future Appointments  Date Time Provider [place]  11/20/2019 11:00 AM Gillis Santa, MD [facility] None  11/21/2019  9:00 AM Jenkins Rouge, LCAS [facility] None  11/27/2019  7:30 AM [facility] None  11/27/2019  8:30 AM Kris Hartmann, NP [facility] None  11/28/2019  9:00 AM Jenkins Rouge, LCAS [facility] None    Note by: Gillis Santa, MD Date: 11/20/2019; Time: 10:50 AM

## 2019-11-20 NOTE — Progress Notes (Signed)
Safety precautions to be maintained throughout the outpatient stay will include: orient to surroundings, keep bed in low position, maintain call bell within reach at all times, provide assistance with transfer out of bed and ambulation.  

## 2019-11-21 ENCOUNTER — Encounter (HOSPITAL_COMMUNITY): Payer: Self-pay | Admitting: Licensed Clinical Social Worker

## 2019-11-21 ENCOUNTER — Ambulatory Visit (INDEPENDENT_AMBULATORY_CARE_PROVIDER_SITE_OTHER): Payer: 59 | Admitting: Licensed Clinical Social Worker

## 2019-11-21 DIAGNOSIS — F331 Major depressive disorder, recurrent, moderate: Secondary | ICD-10-CM | POA: Diagnosis not present

## 2019-11-21 DIAGNOSIS — F411 Generalized anxiety disorder: Secondary | ICD-10-CM

## 2019-11-21 NOTE — Progress Notes (Signed)
Virtual Visit via Video Note  I connected with Holly Howard on 11/21/19 at  9:00 AM EDT by a video enabled telemedicine application and verified that I am speaking with the correct person using two identifiers.   I discussed the limitations of evaluation and management by telemedicine and the availability of in person appointments. The patient expressed understanding and agreed to proceed.  History of Present Illness: Patient is referred to therapy by her PCP because of panic attacks, depression and anxiety due to her health conditions (trigeminal neuroglia, white matter disease, fibromyalgia) and the loss of her dog due to poisoning. Patient began having panic attacks due to her chronic pain which has led to depression and anxiety. Daughter, her husband, 2 grandkids (2, 4), live with her. Her son also lives on the property.     Observations/Objective: Patient presented anxious for her counseling session. Patient discussed her psychiatric symptoms and current life events. Patient reports on her pain management, MRA, Dr. Demetrius Charity appointment, medication increase, pain management clinic.Patient reports current family dysfunction, between siblings and mother. Pt reports the continued family dysfunction. Cln assessed patient's family dynamics and discussed with patient how the dynamics influence her symptoms in a negative way. Used CBT teaching her about the disorder and helping her deal with adjustment of her chronic pain. Played Youtube video on pain management. Explored with patient the benefits of the meditation.       PLAN: Review grounding, relaxation, mindfulness skills   Assessment and Plan: : Counselor will continue to meet with patient to address treatment plan goals. Patient will continue to follow recommendations of providers and implement skills learned in session.   Follow Up Instructions: I discussed the assessment and treatment plan with the patient. The patient was provided an  opportunity to ask questions and all were answered. The patient agreed with the plan and demonstrated an understanding of the instructions.   The patient was advised to call back or seek an in-person evaluation if the symptoms worsen or if the condition fails to improve as anticipated.  I provided 60 minutes of non-face-to-face time during this encounter.   Ansen Sayegh S, LCAS

## 2019-11-27 ENCOUNTER — Ambulatory Visit (INDEPENDENT_AMBULATORY_CARE_PROVIDER_SITE_OTHER): Payer: 59

## 2019-11-27 ENCOUNTER — Ambulatory Visit (INDEPENDENT_AMBULATORY_CARE_PROVIDER_SITE_OTHER): Payer: 59 | Admitting: Nurse Practitioner

## 2019-11-27 ENCOUNTER — Encounter (INDEPENDENT_AMBULATORY_CARE_PROVIDER_SITE_OTHER): Payer: Self-pay | Admitting: Nurse Practitioner

## 2019-11-27 ENCOUNTER — Other Ambulatory Visit: Payer: Self-pay

## 2019-11-27 VITALS — BP 106/71 | HR 84 | Ht 59.0 in | Wt 162.0 lb

## 2019-11-27 DIAGNOSIS — R230 Cyanosis: Secondary | ICD-10-CM

## 2019-11-27 DIAGNOSIS — F17200 Nicotine dependence, unspecified, uncomplicated: Secondary | ICD-10-CM | POA: Diagnosis not present

## 2019-11-27 DIAGNOSIS — M79605 Pain in left leg: Secondary | ICD-10-CM | POA: Diagnosis not present

## 2019-11-27 DIAGNOSIS — M79604 Pain in right leg: Secondary | ICD-10-CM | POA: Diagnosis not present

## 2019-11-28 ENCOUNTER — Encounter (HOSPITAL_COMMUNITY): Payer: Self-pay | Admitting: Licensed Clinical Social Worker

## 2019-11-28 ENCOUNTER — Ambulatory Visit (INDEPENDENT_AMBULATORY_CARE_PROVIDER_SITE_OTHER): Payer: 59 | Admitting: Licensed Clinical Social Worker

## 2019-11-28 DIAGNOSIS — F331 Major depressive disorder, recurrent, moderate: Secondary | ICD-10-CM | POA: Diagnosis not present

## 2019-11-28 DIAGNOSIS — F411 Generalized anxiety disorder: Secondary | ICD-10-CM

## 2019-11-28 NOTE — Progress Notes (Signed)
Virtual Visit via Video Note  I connected with Holly Howard on 11/28/19 at  9:00 AM EDT by a video enabled telemedicine application and verified that I am speaking with the correct person using two identifiers.   I discussed the limitations of evaluation and management by telemedicine and the availability of in person appointments. The patient expressed understanding and agreed to proceed.  LOCATION: Patient: Home Provider: Home  History of Present Illness: Patient is referred to therapy by her PCP because of panic attacks, depression and anxiety due to her health conditions (trigeminal neuroglia, white matter disease, fibromyalgia) and the loss of her dog due to poisoning. Patient began having panic attacks due to her chronic pain which has led to depression and anxiety. Daughter and her husband, 2 grandkids (2, 4), live with her. Her son also lives on the property.     Observations/Objective: Patient presented anxious for her counseling session. Patient discussed her psychiatric symptoms and current life events. Patient discusses her chronic pain and how it affects her daily life activities. Used Socratic questions. Patient discussed the relationship with her son, who lives on the property. He drinks too much, gets in arguments with patient, loses jobs, has no $. "I made him like he is, by enabling him." Cln provided psychoeducation on enabling and boundaries. Patient was hesitant in using these skills. Suggested patient practice skills between sessions. Made new appointments for patient.          PLAN: Review grounding, relaxation, mindfulness skills   Assessment and Plan: : Counselor will continue to meet with patient to address treatment plan goals. Patient will continue to follow recommendations of providers and implement skills learned in session.   Follow Up Instructions: I discussed the assessment and treatment plan with the patient. The patient was provided an opportunity to  ask questions and all were answered. The patient agreed with the plan and demonstrated an understanding of the instructions.   The patient was advised to call back or seek an in-person evaluation if the symptoms worsen or if the condition fails to improve as anticipated.  I provided 60 minutes of non-face-to-face time during this encounter.   Nayquan Evinger S, LCAS

## 2019-11-30 ENCOUNTER — Encounter (INDEPENDENT_AMBULATORY_CARE_PROVIDER_SITE_OTHER): Payer: Self-pay | Admitting: Nurse Practitioner

## 2019-11-30 NOTE — Progress Notes (Signed)
Subjective:    Patient ID: Holly Howard, female    DOB: 09/07/1975, 44 y.o.   MRN: [id number] Chief Complaint  Patient presents with  . Follow-up    u/s follow up    Holly Howard is a 44 y.o. female. Patient returns today for evaluation of lower extremity discoloration and lower extremity discomfort. The patient denies any change in temperature sensation with the color changes. The patient gets a spot she purplish discoloration of the lower legs and feet. It comes and goes without any precipitating events. She also has profound pain in her bilateral lower extremities. She also has pain in her arms her neck and her shoulders. The patient also notes profound headaches and dizziness. She has also noted that her balance has been affected recently and she fell down the stairs about 2 weeks ago. She recently had a check for neuropathy and was negative for neuropathy. The patient does note that she has a strong family history of autoimmune diseases. She denies any significant swelling in her lower extremities. She denies any TIA-like symptoms or amaurosis fugax.  Today noninvasive study showed no evidence of DVT in the bilateral lower extremities. No evidence of superficial venous thrombosis in the bilateral lower extremities. No evidence of deep venous insufficiency seen bilaterally. No evidence of superficial venous insufficiency in the bilateral great and short saphenous veins. There was a noted Baker's cyst behind the right knee measuring 2.14 cm x 1.91 cm.  Previous studies at a previous office visit noted an ABI of 1.22 on the right and 1.15 on the left with triphasic waveforms with only slightly dampened toe PPG's.      Review of Systems  Musculoskeletal: Positive for arthralgias, myalgias and neck pain.  Neurological: Positive for dizziness, weakness, numbness and headaches.  All other systems reviewed and are negative.      Objective:   Physical Exam Vitals reviewed.  HENT:       Head: Normocephalic.  Cardiovascular:     Rate and Rhythm: Normal rate and regular rhythm.     Pulses: Normal pulses.     Heart sounds: Normal heart sounds.  Pulmonary:     Effort: Pulmonary effort is normal.     Breath sounds: Normal breath sounds.  Skin:    General: Skin is warm and dry.     Capillary Refill: Capillary refill takes less than 2 seconds.  Neurological:     Mental Status: She is alert and oriented to person, place, and time.  Psychiatric:        Mood and Affect: Mood normal.        Behavior: Behavior normal.        Thought Content: Thought content normal.        Judgment: Judgment normal.     BP 106/71   Pulse 84   Ht 4\' 11"  (1.499 m)   Wt 162 lb (73.5 kg)   BMI 32.72 kg/m   Past Medical History:  Diagnosis Date  . Anxiety   . Depression   . Hyperlipidemia   . Hypertension   . Trigeminal neuralgia     Social History   Socioeconomic History  . Marital status: Married    Spouse name: Not on file  . Number of children: Not on file  . Years of education: Not on file  . Highest education level: Not on file  Occupational History  . Not on file  Tobacco Use  . Smoking status: Current Every Day Smoker  .  Smokeless tobacco: Never Used  Substance and Sexual Activity  . Alcohol use: Not Currently  . Drug use: Never  . Sexual activity: Not on file  Other Topics Concern  . Not on file  Social History Narrative  . Not on file   Social Determinants of Health   Financial Resource Strain:   . Difficulty of Paying Living Expenses:   Food Insecurity:   . Worried About Charity fundraiser in the Last Year:   . Arboriculturist in the Last Year:   Transportation Needs:   . Film/video editor (Medical):   Marland Kitchen Lack of Transportation (Non-Medical):   Physical Activity:   . Days of Exercise per Week:   . Minutes of Exercise per Session:   Stress:   . Feeling of Stress :   Social Connections:   . Frequency of Communication with Friends and  Family:   . Frequency of Social Gatherings with Friends and Family:   . Attends Religious Services:   . Active Member of Clubs or Organizations:   . Attends Archivist Meetings:   Marland Kitchen Marital Status:   Intimate Partner Violence:   . Fear of Current or Ex-Partner:   . Emotionally Abused:   Marland Kitchen Physically Abused:   . Sexually Abused:     Past Surgical History:  Procedure Laterality Date  . ABDOMINAL ADHESION SURGERY    . ABDOMINAL HYSTERECTOMY    . CESAREAN SECTION    . TONSILLECTOMY      Family History  Problem Relation Age of Onset  . Thyroid disease Mother   . Hyperlipidemia Mother   . Hypertension Mother   . Diabetes Mother     Allergies  Allergen Reactions  . Wasp Venom Anaphylaxis, Hives, Itching, Nausea Only, Shortness Of Breath and Swelling    Other reaction(s): Hallucinations, Headache       Assessment & Plan:   1. Pain in both lower extremities Based on the patient's noninvasive studies, the pain is not related to vascular causes.  Based on the patient's description of systems it is possible that it is more of a widespread or systemic dysfunction which may be caused by an autoimmune disorder given her strong family history.  Patient has recently had studies for neuropathy which were unrevealing as well as a recent MRI of her head which was also unrevealing.  We will place a referral to rheumatology for further work-up. - Ambulatory referral to Rheumatology  2. Tobacco use disorder Smoking cessation was discussed, 3-10 minutes spent on this topic specifically   3. Peripheral cyanosis Patient's arterial perfusion shown on her noninvasive studies but not likely cause discoloration due to lack of circulation.   Current Outpatient Medications on File Prior to Visit  Medication Sig Dispense Refill  . Ascorbic Acid (VITAMIN C) 100 MG tablet Take 100 mg by mouth daily.    . baclofen (LIORESAL) 10 MG tablet Take 5 mg by mouth at bedtime.    . cholecalciferol  (VITAMIN D3) 25 MCG (1000 UNIT) tablet Take 1,000 Units by mouth daily.    . DULoxetine (CYMBALTA) 20 MG capsule Take 20 mg by mouth daily.    . hydrochlorothiazide (HYDRODIURIL) 12.5 MG tablet Take 12.5 mg by mouth as needed.    . hydrOXYzine (ATARAX/VISTARIL) 50 MG tablet Take by mouth.    Marland Kitchen ibuprofen (ADVIL) 800 MG tablet Take by mouth.    Marland Kitchen LORazepam (ATIVAN) 1 MG tablet Take 1 mg by mouth as needed for anxiety.    Marland Kitchen  meclizine (ANTIVERT) 25 MG tablet Take 25 mg by mouth as needed for dizziness.    . ondansetron (ZOFRAN) 24 MG tablet Take 24 mg by mouth as needed for nausea.    . predniSONE (STERAPRED UNI-PAK 21 TAB) 5 MG (21) TBPK tablet See admin instructions.    . sertraline (ZOLOFT) 25 MG tablet TAKE 1 TABLET BY MOUTH ONCE DAILY FOR 30 DAYS    . traZODone (DESYREL) 50 MG tablet Take 25 mg by mouth at bedtime as needed for sleep.     No current facility-administered medications on file prior to visit.    There are no Patient Instructions on file for this visit. No follow-ups on file.   Georgiana Spinner, NP

## 2019-12-10 ENCOUNTER — Other Ambulatory Visit: Payer: Self-pay

## 2019-12-10 ENCOUNTER — Ambulatory Visit (INDEPENDENT_AMBULATORY_CARE_PROVIDER_SITE_OTHER): Payer: 59 | Admitting: Licensed Clinical Social Worker

## 2019-12-10 ENCOUNTER — Encounter (HOSPITAL_COMMUNITY): Payer: Self-pay | Admitting: Licensed Clinical Social Worker

## 2019-12-10 DIAGNOSIS — F411 Generalized anxiety disorder: Secondary | ICD-10-CM

## 2019-12-10 DIAGNOSIS — F331 Major depressive disorder, recurrent, moderate: Secondary | ICD-10-CM

## 2019-12-10 NOTE — Progress Notes (Signed)
Virtual Visit via Video Note  I connected with Holly Howard on 12/10/19 at 10:00 AM EDT by a video enabled telemedicine application and verified that I am speaking with the correct person using two identifiers.   I discussed the limitations of evaluation and management by telemedicine and the availability of in person appointments. The patient expressed understanding and agreed to proceed.  LOCATION: Patient: Home Provider: Home  History of Present Illness: Patient is referred to therapy by her PCP because of panic attacks, depression and anxiety due to her health conditions (trigeminal neuroglia, white matter disease, fibromyalgia) and the loss of her dog due to poisoning. Patient began having panic attacks due to her chronic pain which has led to depression and anxiety. Daughter and her husband, 2 grandkids (2, 4), live with her. Her son also lives on the property.     Observations/Objective: Patient presented anxious for her counseling session. Patient discussed her psychiatric symptoms and current life events. Patient reports she is feeling less pain which improves her moods. Patient is researching pain relief alternatives recommended by pain management clinic, however, her insurance will not cover it. Cln suggested patient go on website to see if the company may assist with costs. Patient discussed the co-dependency relationship with her son. Cln provided psychoeducation on codependency. Readdressed enabling and boundaries. Pt shared she struggled using boundaries between sessions. Asked open ended questions.   PLAN: Review grounding, relaxation, mindfulness skills   Assessment and Plan: : Counselor will continue to meet with patient to address treatment plan goals. Patient will continue to follow recommendations of providers and implement skills learned in session.   Follow Up Instructions: I discussed the assessment and treatment plan with the patient. The patient was provided an  opportunity to ask questions and all were answered. The patient agreed with the plan and demonstrated an understanding of the instructions.   The patient was advised to call back or seek an in-person evaluation if the symptoms worsen or if the condition fails to improve as anticipated.  I provided 45 minutes of non-face-to-face time during this encounter.   Theta Leaf S, LCAS

## 2019-12-17 ENCOUNTER — Encounter (HOSPITAL_COMMUNITY): Payer: Self-pay | Admitting: Licensed Clinical Social Worker

## 2019-12-17 ENCOUNTER — Ambulatory Visit (INDEPENDENT_AMBULATORY_CARE_PROVIDER_SITE_OTHER): Payer: 59 | Admitting: Licensed Clinical Social Worker

## 2019-12-17 ENCOUNTER — Other Ambulatory Visit: Payer: Self-pay

## 2019-12-17 DIAGNOSIS — F411 Generalized anxiety disorder: Secondary | ICD-10-CM

## 2019-12-17 DIAGNOSIS — F331 Major depressive disorder, recurrent, moderate: Secondary | ICD-10-CM

## 2019-12-17 NOTE — Progress Notes (Signed)
Virtual Visit via Video Note  I connected with Holly Howard on 12/17/19 at 10:00 AM EDT by a video enabled telemedicine application and verified that I am speaking with the correct person using two identifiers.   I discussed the limitations of evaluation and management by telemedicine and the availability of in person appointments. The patient expressed understanding and agreed to proceed.  LOCATION: Patient: Home Provider: Home  History of Present Illness: Patient is referred to therapy by her PCP because of panic attacks, depression and anxiety due to her health conditions (trigeminal neuroglia, white matter disease, fibromyalgia) and the loss of her dog due to poisoning. Patient began having panic attacks due to her chronic pain which has led to depression and anxiety. Daughter and her husband, 2 grandkids (2, 4), live with her. Her son also lives on the property.     Observations/Objective: Patient presented anxious for her counseling session. Patient discussed her psychiatric symptoms and current life events. Patient reports she is feeling more pain, now she is experiencing carpal tunnel pain. Used socratic questions. Patient reports she has to have HRT. Asked open ended questions. Patient discussed her improved relationship with her son, working on not being co-dependent, trying to not enable and boundaries. Role played boundaries with family. Suggested patient practice using boundaries, being less co-dependent, not enabling between session. Reviewed grounding, relaxation, mindfulness skills.  PLAN: Relationship with mother and sister   Assessment and Plan: : Counselor will continue to meet with patient to address treatment plan goals. Patient will continue to follow recommendations of providers and implement skills learned in session.   Follow Up Instructions: I discussed the assessment and treatment plan with the patient. The patient was provided an opportunity to ask questions and  all were answered. The patient agreed with the plan and demonstrated an understanding of the instructions.   The patient was advised to call back or seek an in-person evaluation if the symptoms worsen or if the condition fails to improve as anticipated.  I provided 45 minutes of non-face-to-face time during this encounter.   Deundra Furber S, LCAS

## 2019-12-24 ENCOUNTER — Ambulatory Visit (HOSPITAL_COMMUNITY): Payer: 59 | Admitting: Licensed Clinical Social Worker

## 2019-12-24 ENCOUNTER — Other Ambulatory Visit: Payer: Self-pay

## 2019-12-31 ENCOUNTER — Other Ambulatory Visit: Payer: Self-pay

## 2019-12-31 ENCOUNTER — Ambulatory Visit (HOSPITAL_COMMUNITY): Payer: 59 | Admitting: Licensed Clinical Social Worker

## 2020-01-02 ENCOUNTER — Encounter (INDEPENDENT_AMBULATORY_CARE_PROVIDER_SITE_OTHER): Payer: Self-pay | Admitting: Nurse Practitioner

## 2021-03-06 ENCOUNTER — Telehealth (INDEPENDENT_AMBULATORY_CARE_PROVIDER_SITE_OTHER): Payer: Self-pay | Admitting: Vascular Surgery

## 2021-03-06 NOTE — Telephone Encounter (Signed)
Patient was made aware with medical advice and verbalized understanding 

## 2021-03-06 NOTE — Telephone Encounter (Signed)
We don't treat baker's cysts, those are typically handled by orthopedic physicians and sometimes PCPs.  We typically handle varicose veins.  If shes having vein issues we can see her

## 2021-03-06 NOTE — Telephone Encounter (Signed)
Patient called stating she has been having issues with the cyst that's on her right knee and was told by Dr. Wyn Quaker at her visit last year that if she started having issues to call and let us know. Patient states that the area hurts and is constantly swollen.  She wanted to see if she can come in to get it checked out. Please advise.

## 2021-03-10 ENCOUNTER — Ambulatory Visit
Admission: RE | Admit: 2021-03-10 | Discharge: 2021-03-10 | Disposition: A | Discharge status: Home / Self Care | Payer: 59 | Source: Ambulatory Visit | Attending: Physician Assistant | Admitting: Physician Assistant

## 2021-03-10 ENCOUNTER — Other Ambulatory Visit: Payer: Self-pay | Admitting: Physician Assistant

## 2021-03-10 DIAGNOSIS — M79642 Pain in left hand: Secondary | ICD-10-CM

## 2021-03-10 DIAGNOSIS — M25569 Pain in unspecified knee: Secondary | ICD-10-CM

## 2021-03-10 DIAGNOSIS — M79641 Pain in right hand: Secondary | ICD-10-CM

## 2021-03-10 DIAGNOSIS — M542 Cervicalgia: Secondary | ICD-10-CM

## 2021-03-10 DIAGNOSIS — M25571 Pain in right ankle and joints of right foot: Secondary | ICD-10-CM

## 2021-03-10 DIAGNOSIS — M549 Dorsalgia, unspecified: Secondary | ICD-10-CM

## 2021-03-10 DIAGNOSIS — M25572 Pain in left ankle and joints of left foot: Secondary | ICD-10-CM

## 2021-04-27 NOTE — Progress Notes (Signed)
Office Visit Note  Patient: Holly Howard             Date of Birth: 19-Aug-1975           MRN: [id number]             PCP: Joaquin Music, NP Referring: [facility]* Visit Date: 05/11/2021 Occupation: @GUAROCC @  Subjective:  Pain in multiple joints   History of Present Illness: Holly Howard is a 45 y.o. female seen in consultation per request of [facility].  Cording to the patient her symptoms a started about 15 years ago with neck and shoulder pain.  She recalls using Lidoderm patches on her neck area.  She also gives history of fatigue almost all of her life.  She states 8 years ago she started having symptoms of left trigeminal neuralgia and 3 years ago right trigeminal neuralgia.  The symptoms a started after dental work.  She had been under care of a neurologist who tried different medications but nothing worked.  She states 3 years ago she started experiencing pain all over her body with burning sensation in her torso and her legs.  She also gives history of intermittent swelling in her hands and her legs.  She states 1-1/2 years ago her PCP placed her on Cymbalta which was very helpful.  She was also referred to the pain clinic and 3 months ago she started going to the pain clinic for her hydrocodone and Lyrica was added.  She states that gave her a lot of relief.  She describes her pain without the medications on the scale of 0-10 about 9 and with medications about 3.  She states at the pain clinic she had multiple x-rays ordered which were all within normal limits.  She continues to have generalized pain and hyperalgesia.  She is very sensitive to touch.  She feels that her hands and feet intermittently swell.  There is family history of rheumatoid arthritis in a cousin.  She is gravida 3, para 2, abortion 1.  History of DVTs.  She had hysterectomy at age 1.  Activities of Daily Living:  Patient reports morning stiffness for 1-2 hours.   Patient  Reports nocturnal pain.  Difficulty dressing/grooming: Denies Difficulty climbing stairs: Reports Difficulty getting out of chair: Denies Difficulty using hands for taps, buttons, cutlery, and/or writing: Denies  Review of Systems  Constitutional:  Positive for fatigue.  HENT:  Negative for mouth sores, mouth dryness and nose dryness.   Eyes:  Negative for pain, itching and dryness.  Respiratory:  Negative for shortness of breath and difficulty breathing.   Cardiovascular:  Negative for chest pain and palpitations.  Gastrointestinal:  Positive for constipation. Negative for blood in stool and diarrhea.  Endocrine: Negative for increased urination.  Genitourinary:  Negative for difficulty urinating.  Musculoskeletal:  Positive for joint pain, joint pain, joint swelling, myalgias, morning stiffness, muscle tenderness and myalgias.  Skin:  Positive for color change. Negative for rash, redness and sensitivity to sunlight.  Allergic/Immunologic: Negative for susceptible to infections.  Neurological:  Positive for numbness, headaches and weakness. Negative for dizziness and memory loss.  Hematological:  Positive for bruising/bleeding tendency.  Psychiatric/Behavioral:  Positive for depressed mood and sleep disturbance. Negative for confusion. The patient is not nervous/anxious.    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   Musculoskeletal back pain 11/20/2019   Marijuana use 11/20/2019   Fibromyalgia 11/20/2019   Numbness and tingling of both feet 10/29/2019   MDD (major depressive disorder), recurrent episode, moderate (HCC) 10/24/2019   Bilateral hand numbness 10/22/2019   Tobacco use disorder 10/19/2019   Pain in limb 10/19/2019   Peripheral cyanosis 10/19/2019   Balance problem 09/21/2019   Dizziness 09/21/2019   Memory  change 09/21/2019   Vitamin D deficiency 04/21/2019   Severe episode of recurrent major depressive disorder, without psychotic features (HCC) 04/16/2019   Chronic fatigue syndrome with fibromyalgia 11/19/2018   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 11/19/2018    Past Medical History:  Diagnosis Date   Anxiety    Depression    Hyperlipidemia    Hypertension    Trigeminal neuralgia     Family History  Problem Relation Age of Onset   Thyroid disease Mother    Hyperlipidemia Mother    Hypertension Mother    Diabetes Mother    Fibromyalgia Mother    Suicidality Father    Fibromyalgia Sister    Healthy Son    Healthy Daughter    Past Surgical History:  Procedure Laterality Date   ABDOMINAL ADHESION SURGERY     x2   ABDOMINAL HYSTERECTOMY     CESAREAN SECTION     x2   TONSILLECTOMY     Social History   Social History Narrative   Not on file    There is no immunization history on file for this patient.   Objective: Vital Signs: BP 115/82 (BP Location: Right Arm, Patient Position: Sitting, Cuff Size: Normal)   Pulse 75   Ht 5' (1.524 m)   Wt 164 lb 12.8 oz (74.8 kg)   BMI 32.19 kg/m    Physical Exam Vitals and nursing note reviewed.  Constitutional:      Appearance: She is well-developed.  HENT:     Head: Normocephalic and atraumatic.  Eyes:     Conjunctiva/sclera: Conjunctivae normal.  Cardiovascular:     Rate and Rhythm: Normal rate and regular rhythm.     Heart sounds: Normal heart sounds.  Pulmonary:     Effort: Pulmonary effort is normal.     Breath sounds: Normal breath sounds.  Abdominal:     General: Bowel sounds are normal.     Palpations: Abdomen is soft.  Musculoskeletal:     Cervical back: Normal range of motion.  Lymphadenopathy:     Cervical: No cervical adenopathy.  Skin:    General: Skin is warm and dry.     Capillary Refill: Capillary refill takes less than 2 seconds.     Comments: Livedo reticularis was noted.  No nailbed capillary  changes, Telengectesia or sclerodactyly were noted.  Neurological:     Mental Status: She is alert and oriented to person, place, and time.  Psychiatric:        Behavior: Behavior normal.     Musculoskeletal Exam: C-spine, thoracic and lumbar spine were in good range of motion.  Shoulder joints, elbow joints, wrist joints, MCPs PIPs and DIPs with good range of motion with no synovitis.  Hip joints, knee joints, ankles, MTPs and PIPs with good range of motion with no synovitis.  CDAI Exam: CDAI Score: -- Patient Global: --; Provider Global: -- Swollen: --; Tender: -- Joint Exam 05/11/2021   No joint exam has been documented for this visit   There is currently no information documented on the homunculus. Go to the  Rheumatology activity and complete the homunculus joint exam.  Investigation: No additional findings.  Imaging: No results found.  Recent Labs: No results found for: WBC, HGB, PLT, NA, K, CL, CO2, GLUCOSE, BUN, CREATININE, BILITOT, ALKPHOS, AST, ALT, PROT, ALBUMIN, CALCIUM, GFRAA, QFTBGOLD, QFTBGOLDPLUS  Speciality Comments: No specialty comments available.  Procedures:  No procedures performed Allergies: Wasp venom and Other   Assessment / Plan:     Visit Diagnoses: Polyarthralgia -patient complains of pain and discomfort in multiple joints for many years.  She complains of discomfort in her neck and lower back, hands, knees and her ankles.  She notices intermittent swelling.  No synovitis was noted.  I reviewed x-rays done in September 2022.  The cervical lumbar spine showed mild degenerative changes.  X-rays of her bilateral hands, ankles and her knee joints were unremarkable.  To complete the work-up I will obtain additional labs today.- Plan: Sedimentation rate, Rheumatoid factor, Cyclic citrul peptide antibody, IgG  Bilateral hand numbness-she complains of numbness in her hands and her feet for many years.  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  She had good capillary refill without any nailbed capillary changes or sclerodactyly.  I will obtain following labs today.  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  She had extensive work-up by the neurologist.  She states the symptoms are improved on Cymbalta.  Fibromyalgia-she has longstanding history of fibromyalgia.  She complains of generalized pain hyperalgesia and fatigue.  She has noticed improvement with Cymbalta and more improvement after starting on Lyrica and hydrocodone.  Chronic pain syndrome-she has been followed by [facility].  Chronic fatigue syndrome -she continues to have fatigue for many years which could be due to fibromyalgia.  Plan: CBC with Differential/Platelet, COMPLETE METABOLIC PANEL WITH GFR, CK  Vitamin D deficiency  Neck pain-she complains of neck pain.  Her x-ray showed mild facet joint changes.  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  Her symptoms improved with current combination of medications.  Trigeminal neuralgia-evaluated by neurology in the past.  Symptoms are better with Cymbalta and Lyrica.  Orders: Orders Placed This Encounter  Procedures   CBC with Differential/Platelet   COMPLETE METABOLIC PANEL WITH GFR   Sedimentation rate   CK   Rheumatoid factor   Cyclic citrul peptide antibody, IgG   ANA   RNP Antibody   Anti-Smith antibody   Sjogrens  syndrome-A extractable nuclear antibody   Sjogrens syndrome-B extractable nuclear antibody   Anti-DNA antibody, double-stranded   Anti-scleroderma antibody   C3 and C4   Beta-2 glycoprotein antibodies   Cardiolipin antibodies, IgG, IgM, IgA   Lupus Anticoagulant Eval w/Reflex    No orders of the defined types were placed in this encounter.    Follow-Up Instructions: Return for Myalgias, arthralgias, livedo reticularis.   Pollyann Savoy, MD  Note - This record has been created using Animal nutritionist.  Chart creation errors have been sought, but may not always  have been located. Such creation errors do not reflect on  the standard of medical care.

## 2021-05-11 ENCOUNTER — Ambulatory Visit (INDEPENDENT_AMBULATORY_CARE_PROVIDER_SITE_OTHER): Payer: 59 | Admitting: Rheumatology

## 2021-05-11 ENCOUNTER — Encounter: Payer: Self-pay | Admitting: Rheumatology

## 2021-05-11 ENCOUNTER — Other Ambulatory Visit: Payer: Self-pay

## 2021-05-11 VITALS — BP 115/82 | HR 75 | Ht 60.0 in | Wt 164.8 lb

## 2021-05-11 DIAGNOSIS — R200 Anesthesia of skin: Secondary | ICD-10-CM

## 2021-05-11 DIAGNOSIS — G500 Trigeminal neuralgia: Secondary | ICD-10-CM

## 2021-05-11 DIAGNOSIS — F332 Major depressive disorder, recurrent severe without psychotic features: Secondary | ICD-10-CM

## 2021-05-11 DIAGNOSIS — F1290 Cannabis use, unspecified, uncomplicated: Secondary | ICD-10-CM

## 2021-05-11 DIAGNOSIS — R231 Pallor: Secondary | ICD-10-CM | POA: Diagnosis not present

## 2021-05-11 DIAGNOSIS — R230 Cyanosis: Secondary | ICD-10-CM

## 2021-05-11 DIAGNOSIS — M542 Cervicalgia: Secondary | ICD-10-CM

## 2021-05-11 DIAGNOSIS — M797 Fibromyalgia: Secondary | ICD-10-CM

## 2021-05-11 DIAGNOSIS — M26629 Arthralgia of temporomandibular joint, unspecified side: Secondary | ICD-10-CM | POA: Diagnosis not present

## 2021-05-11 DIAGNOSIS — M2550 Pain in unspecified joint: Secondary | ICD-10-CM

## 2021-05-11 DIAGNOSIS — B0223 Postherpetic polyneuropathy: Secondary | ICD-10-CM

## 2021-05-11 DIAGNOSIS — E559 Vitamin D deficiency, unspecified: Secondary | ICD-10-CM

## 2021-05-11 DIAGNOSIS — G894 Chronic pain syndrome: Secondary | ICD-10-CM

## 2021-05-11 DIAGNOSIS — G9332 Myalgic encephalomyelitis/chronic fatigue syndrome: Secondary | ICD-10-CM

## 2021-05-14 LAB — BETA-2 GLYCOPROTEIN ANTIBODIES
Beta-2 Glyco 1 IgA: <2 U/mL
Beta-2 Glyco 1 IgM: <2 U/mL
Beta-2 Glyco I IgG: <2 U/mL

## 2021-05-14 LAB — CBC WITH DIFFERENTIAL/PLATELET
Absolute Monocytes: 475 cells/uL (ref 200–950)
Basophils Absolute: 44 cells/uL (ref 0–200)
Basophils Relative: 0.6 %
Eosinophils Absolute: 22 cells/uL (ref 15–500)
Eosinophils Relative: 0.3 %
HCT: 41.7 % (ref 35.0–45.0)
Hemoglobin: 13.9 g/dL (ref 11.7–15.5)
Lymphs Abs: 3183 cells/uL (ref 850–3900)
MCH: 32.9 pg (ref 27.0–33.0)
MCHC: 33.3 g/dL (ref 32.0–36.0)
MCV: 98.6 fL (ref 80.0–100.0)
MPV: 10.8 fL (ref 7.5–12.5)
Monocytes Relative: 6.5 %
Neutro Abs: 3577 cells/uL (ref 1500–7800)
Neutrophils Relative %: 49 %
Platelets: 324 10*3/uL (ref 140–400)
RBC: 4.23 10*6/uL (ref 3.80–5.10)
RDW: 12.4 % (ref 11.0–15.0)
Total Lymphocyte: 43.6 %
WBC: 7.3 10*3/uL (ref 3.8–10.8)

## 2021-05-14 LAB — COMPLETE METABOLIC PANEL WITH GFR
AG Ratio: 1.7 (calc) (ref 1.0–2.5)
ALT: 17 U/L (ref 6–29)
AST: 20 U/L (ref 10–35)
Albumin: 4.5 g/dL (ref 3.6–5.1)
Alkaline phosphatase (APISO): 57 U/L (ref 31–125)
BUN: 11 mg/dL (ref 7–25)
CO2: 25 mmol/L (ref 20–32)
Calcium: 9.8 mg/dL (ref 8.6–10.2)
Chloride: 104 mmol/L (ref 98–110)
Creat: 0.73 mg/dL (ref 0.50–0.99)
Globulin: 2.7 g/dL (calc) (ref 1.9–3.7)
Glucose, Bld: 85 mg/dL (ref 65–99)
Potassium: 4.6 mmol/L (ref 3.5–5.3)
Sodium: 137 mmol/L (ref 135–146)
Total Bilirubin: 0.3 mg/dL (ref 0.2–1.2)
Total Protein: 7.2 g/dL (ref 6.1–8.1)
eGFR: 103 mL/min/{1.73_m2} (ref >=60)

## 2021-05-14 LAB — ANTI-DNA ANTIBODY, DOUBLE-STRANDED: ds DNA Ab: <1 IU/mL

## 2021-05-14 LAB — C3 AND C4
C3 Complement: 147 mg/dL (ref 83–193)
C4 Complement: 37 mg/dL (ref 15–57)

## 2021-05-14 LAB — CARDIOLIPIN ANTIBODIES, IGG, IGM, IGA
Anticardiolipin IgA: <2 APL-U/mL
Anticardiolipin IgG: <2 GPL-U/mL
Anticardiolipin IgM: <2 MPL-U/mL

## 2021-05-14 LAB — LUPUS ANTICOAGULANT EVAL W/ REFLEX
PTT-LA Screen: 37 s (ref <=40)
dRVVT: 33 s (ref <=45)

## 2021-05-14 LAB — SEDIMENTATION RATE: Sed Rate: 9 mm/h (ref 0–20)

## 2021-05-14 LAB — SJOGRENS SYNDROME-B EXTRACTABLE NUCLEAR ANTIBODY: SSB (La) (ENA) Antibody, IgG: <1 AI

## 2021-05-14 LAB — CYCLIC CITRUL PEPTIDE ANTIBODY, IGG: Cyclic Citrullin Peptide Ab: <16 UNITS

## 2021-05-14 LAB — SJOGRENS SYNDROME-A EXTRACTABLE NUCLEAR ANTIBODY: SSA (Ro) (ENA) Antibody, IgG: <1 AI

## 2021-05-14 LAB — RNP ANTIBODY: Ribonucleic Protein(ENA) Antibody, IgG: <1 AI

## 2021-05-14 LAB — CK: Total CK: 69 U/L (ref 29–143)

## 2021-05-14 LAB — RHEUMATOID FACTOR: Rheumatoid fact SerPl-aCnc: <14 IU/mL (ref <14)

## 2021-05-14 LAB — ANTI-SCLERODERMA ANTIBODY: Scleroderma (Scl-70) (ENA) Antibody, IgG: <1 AI

## 2021-05-14 LAB — ANA: Anti Nuclear Antibody (ANA): NEGATIVE

## 2021-05-14 LAB — ANTI-SMITH ANTIBODY: ENA SM Ab Ser-aCnc: <1 AI

## 2021-05-14 NOTE — Progress Notes (Signed)
All the labs are within normal limits.  I will discuss results at the follow-up visit.

## 2021-05-15 NOTE — Progress Notes (Signed)
It is up to the patient.  If she would prefer not to come for a follow-up visit that should be fine.Please forward labs to the referring doctor.

## 2021-06-23 ENCOUNTER — Ambulatory Visit: Payer: 59 | Admitting: Rheumatology

## 2021-08-17 NOTE — Progress Notes (Deleted)
Office Visit Note  Patient: Holly Howard             Date of Birth: Feb 25, 1976           MRN: [id number]             PCP: Laverle Hobby, NP Referring: Laverle Hobby, NP Visit Date: 08/27/2021 Occupation: @GUAROCC @  Subjective:  No chief complaint on file.   History of Present Illness: Holly Howard is a 46 y.o. female ***   Activities of Daily Living:  Patient reports morning stiffness for *** {minute/hour:19697}.   Patient {ACTIONS;DENIES/REPORTS:21021675::"Denies"} nocturnal pain.  Difficulty dressing/grooming: {ACTIONS;DENIES/REPORTS:21021675::"Denies"} Difficulty climbing stairs: {ACTIONS;DENIES/REPORTS:21021675::"Denies"} Difficulty getting out of chair: {ACTIONS;DENIES/REPORTS:21021675::"Denies"} Difficulty using hands for taps, buttons, cutlery, and/or writing: {ACTIONS;DENIES/REPORTS:21021675::"Denies"}  No Rheumatology ROS completed.   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   Musculoskeletal back pain 11/20/2019   Marijuana use 11/20/2019   Fibromyalgia 11/20/2019   Numbness and tingling of both feet 10/29/2019   MDD (major depressive disorder), recurrent episode, moderate (HCC) 10/24/2019   Bilateral hand numbness 10/22/2019   Tobacco use disorder 10/19/2019   Pain in limb 10/19/2019   Peripheral cyanosis 10/19/2019   Balance problem 09/21/2019   Dizziness 09/21/2019   Memory change 09/21/2019   Vitamin D deficiency 04/21/2019   Severe episode of recurrent major depressive disorder, without psychotic features ([place]) 04/16/2019   Chronic fatigue syndrome with fibromyalgia 11/19/2018   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 11/19/2018    Past Medical History:  Diagnosis Date   Anxiety    Depression    Hyperlipidemia    Hypertension    Trigeminal neuralgia     Family History  Problem  Relation Age of Onset   Thyroid disease Mother    Hyperlipidemia Mother    Hypertension Mother    Diabetes Mother    Fibromyalgia Mother    Suicidality Father    Fibromyalgia Sister    Healthy Son    Healthy Daughter    Past Surgical History:  Procedure Laterality Date   ABDOMINAL ADHESION SURGERY     x2   ABDOMINAL HYSTERECTOMY     CESAREAN SECTION     x2   TONSILLECTOMY     Social History   Social History Narrative   Not on file    There is no immunization history on file for this patient.   Objective: Vital Signs: There were no vitals taken for this visit.   Physical Exam   Musculoskeletal Exam: ***  CDAI Exam: CDAI Score: -- Patient Global: --; Provider Global: -- Swollen: --; Tender: -- Joint Exam 08/27/2021   No joint exam has been documented for this visit   There is currently no information documented on the homunculus. Go to the Rheumatology activity and complete the homunculus joint exam.  Investigation: No additional findings.  Imaging: No results found.  Recent Labs: Lab Results  Component Value Date   WBC 7.3 05/11/2021   HGB 13.9 05/11/2021   PLT 324 05/11/2021   NA 137 05/11/2021   K 4.6 05/11/2021   CL 104 05/11/2021   CO2 25 05/11/2021   GLUCOSE 85 05/11/2021   BUN 11 05/11/2021   CREATININE 0.73 05/11/2021   BILITOT 0.3 05/11/2021   AST 20 05/11/2021   ALT 17 05/11/2021   PROT 7.2 05/11/2021   CALCIUM 9.8 05/11/2021   May 11, 2021 ESR 9, CK 69, RF negative, anti-CCP negative, ANA negative, ENA negative, C3-C4 negative, lupus anticoagulant negative, anticardiolipin negative, beta-2 GP 1 negative  Speciality Comments: No specialty comments available.  Procedures:  No procedures performed Allergies: Wasp venom and Other   Assessment / Plan:     Visit Diagnoses: No diagnosis found.  Orders: No orders of the defined types were placed in this encounter.  No orders of the defined types were placed in this  encounter.   Face-to-face time spent with patient was *** minutes. Greater than 50% of time was spent in counseling and coordination of care.  Follow-Up Instructions: No follow-ups on file.   Bo Merino, MD  Note - This record has been created using Editor, commissioning.  Chart creation errors have been sought, but may not always  have been located. Such creation errors do not reflect on  the standard of medical care.

## 2021-08-27 ENCOUNTER — Ambulatory Visit: Payer: 59 | Admitting: Rheumatology

## 2021-08-27 DIAGNOSIS — B0223 Postherpetic polyneuropathy: Secondary | ICD-10-CM

## 2021-08-27 DIAGNOSIS — G894 Chronic pain syndrome: Secondary | ICD-10-CM

## 2021-08-27 DIAGNOSIS — M542 Cervicalgia: Secondary | ICD-10-CM

## 2021-08-27 DIAGNOSIS — G9332 Myalgic encephalomyelitis/chronic fatigue syndrome: Secondary | ICD-10-CM

## 2021-08-27 DIAGNOSIS — R200 Anesthesia of skin: Secondary | ICD-10-CM

## 2021-08-27 DIAGNOSIS — M2550 Pain in unspecified joint: Secondary | ICD-10-CM

## 2021-08-27 DIAGNOSIS — R231 Pallor: Secondary | ICD-10-CM

## 2021-08-27 DIAGNOSIS — M26629 Arthralgia of temporomandibular joint, unspecified side: Secondary | ICD-10-CM

## 2021-08-27 DIAGNOSIS — F332 Major depressive disorder, recurrent severe without psychotic features: Secondary | ICD-10-CM

## 2021-08-27 DIAGNOSIS — E559 Vitamin D deficiency, unspecified: Secondary | ICD-10-CM

## 2021-08-27 DIAGNOSIS — G500 Trigeminal neuralgia: Secondary | ICD-10-CM

## 2021-08-27 DIAGNOSIS — M797 Fibromyalgia: Secondary | ICD-10-CM

## 2021-09-18 ENCOUNTER — Other Ambulatory Visit: Payer: Self-pay | Admitting: Nurse Practitioner

## 2021-09-18 DIAGNOSIS — M4727 Other spondylosis with radiculopathy, lumbosacral region: Secondary | ICD-10-CM

## 2021-10-19 ENCOUNTER — Other Ambulatory Visit: Payer: Managed Care, Other (non HMO)

## 2022-05-03 ENCOUNTER — Encounter (INDEPENDENT_AMBULATORY_CARE_PROVIDER_SITE_OTHER): Payer: Self-pay

## 2023-05-24 ENCOUNTER — Encounter: Payer: Self-pay | Admitting: Diagnostic Neuroimaging

## 2023-05-24 ENCOUNTER — Ambulatory Visit (INDEPENDENT_AMBULATORY_CARE_PROVIDER_SITE_OTHER): Payer: Managed Care, Other (non HMO) | Admitting: Diagnostic Neuroimaging

## 2023-05-24 VITALS — BP 125/69 | HR 101 | Ht 59.0 in | Wt 180.8 lb

## 2023-05-24 DIAGNOSIS — R269 Unspecified abnormalities of gait and mobility: Secondary | ICD-10-CM | POA: Diagnosis not present

## 2023-05-24 DIAGNOSIS — R413 Other amnesia: Secondary | ICD-10-CM | POA: Diagnosis not present

## 2023-05-24 DIAGNOSIS — R29898 Other symptoms and signs involving the musculoskeletal system: Secondary | ICD-10-CM | POA: Diagnosis not present

## 2023-05-24 DIAGNOSIS — R1310 Dysphagia, unspecified: Secondary | ICD-10-CM | POA: Diagnosis not present

## 2023-05-24 DIAGNOSIS — H538 Other visual disturbances: Secondary | ICD-10-CM
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
## 2023-05-24 NOTE — Patient Instructions (Signed)
  LOWER EXTREMITY WEAKNESS, GAIT DIFFICULTY, URGE INCONTINENCE - check MRI lumbar spine  BLURRED VISION, MEMORY LOSS, SWALLOW DIFFICULTY - check MRI brain  TRIGEMINAL NEURALGIA - duloxetine  CHRONIC PAIN SYNDROME - oxycodone, ibuprofen  DEPRESSION, ANXIETY - fluoxetine, abilify

## 2023-05-30 ENCOUNTER — Telehealth: Payer: Self-pay | Admitting: Diagnostic Neuroimaging

## 2023-05-30 NOTE — Telephone Encounter (Signed)
sent to GI they obtain Holly Howard: [id number] exp. 05/30/23-07/28/23  [id number]

## 2023-06-07 IMAGING — CR DG ANKLE COMPLETE 3+V*R*
3 series · 3 of 3 positions shown · non-contrast
Comparison: None.

CLINICAL DATA: Bilateral ankle pain.  Pain for several years.

EXAM:
RIGHT ANKLE - COMPLETE 3+ VIEW

[t ankle joint ap right]
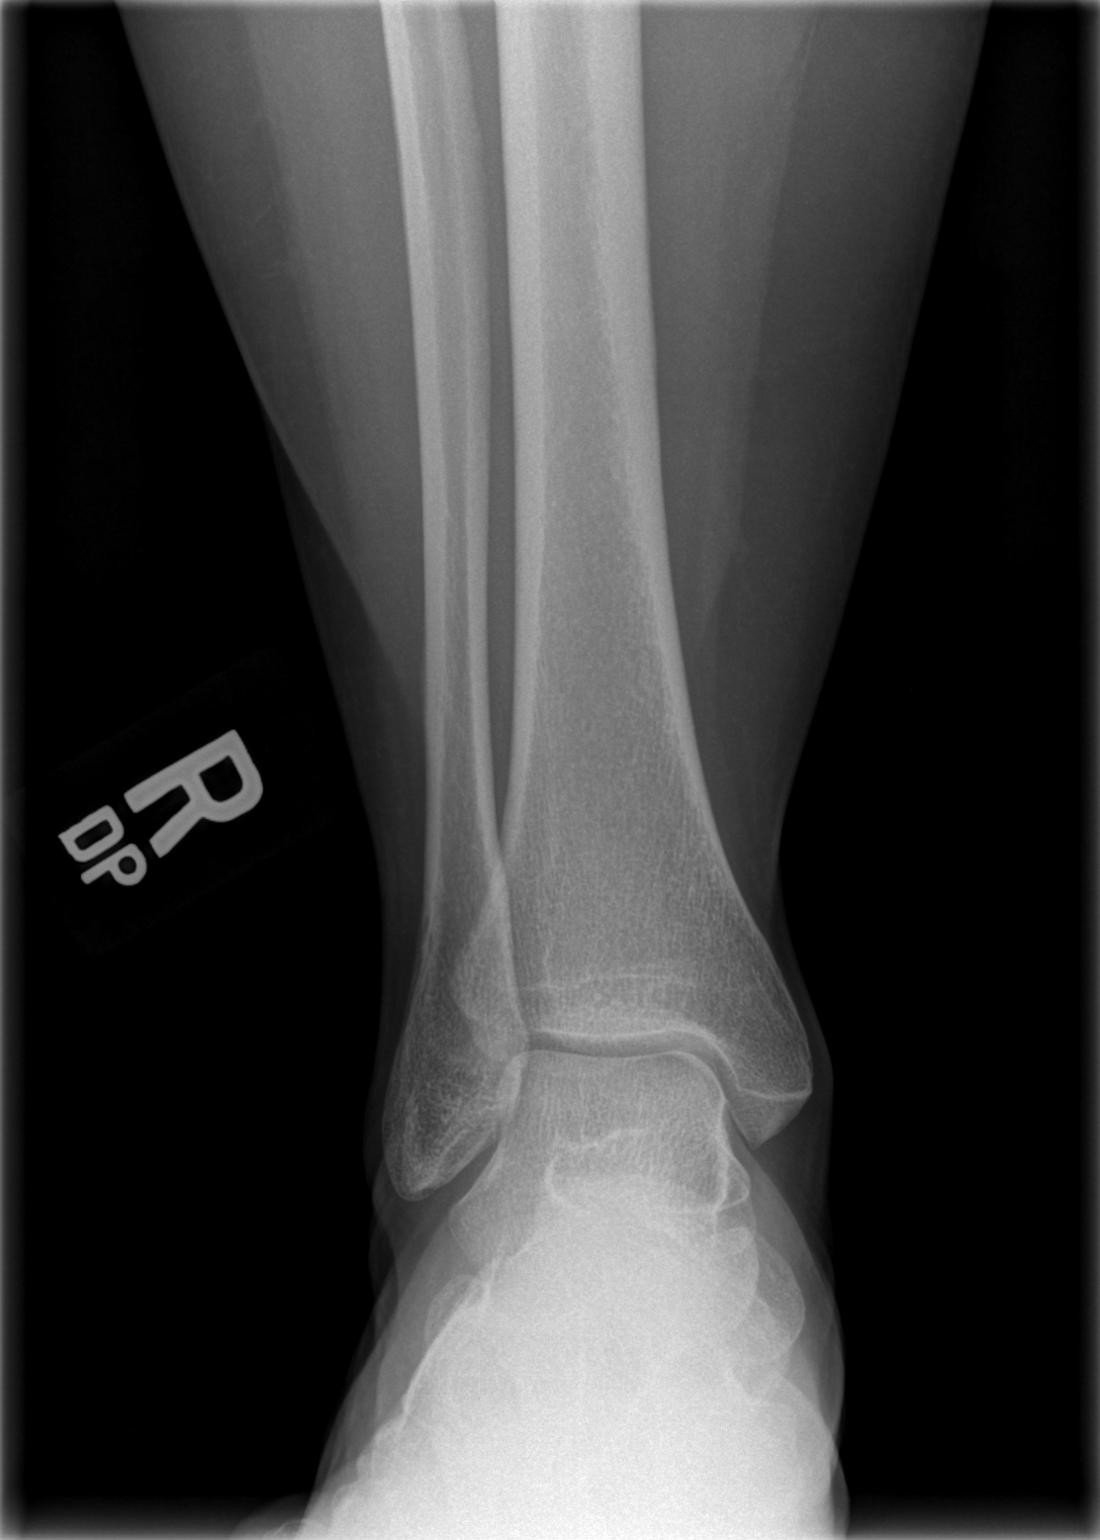

[t ankle joint oblique right]
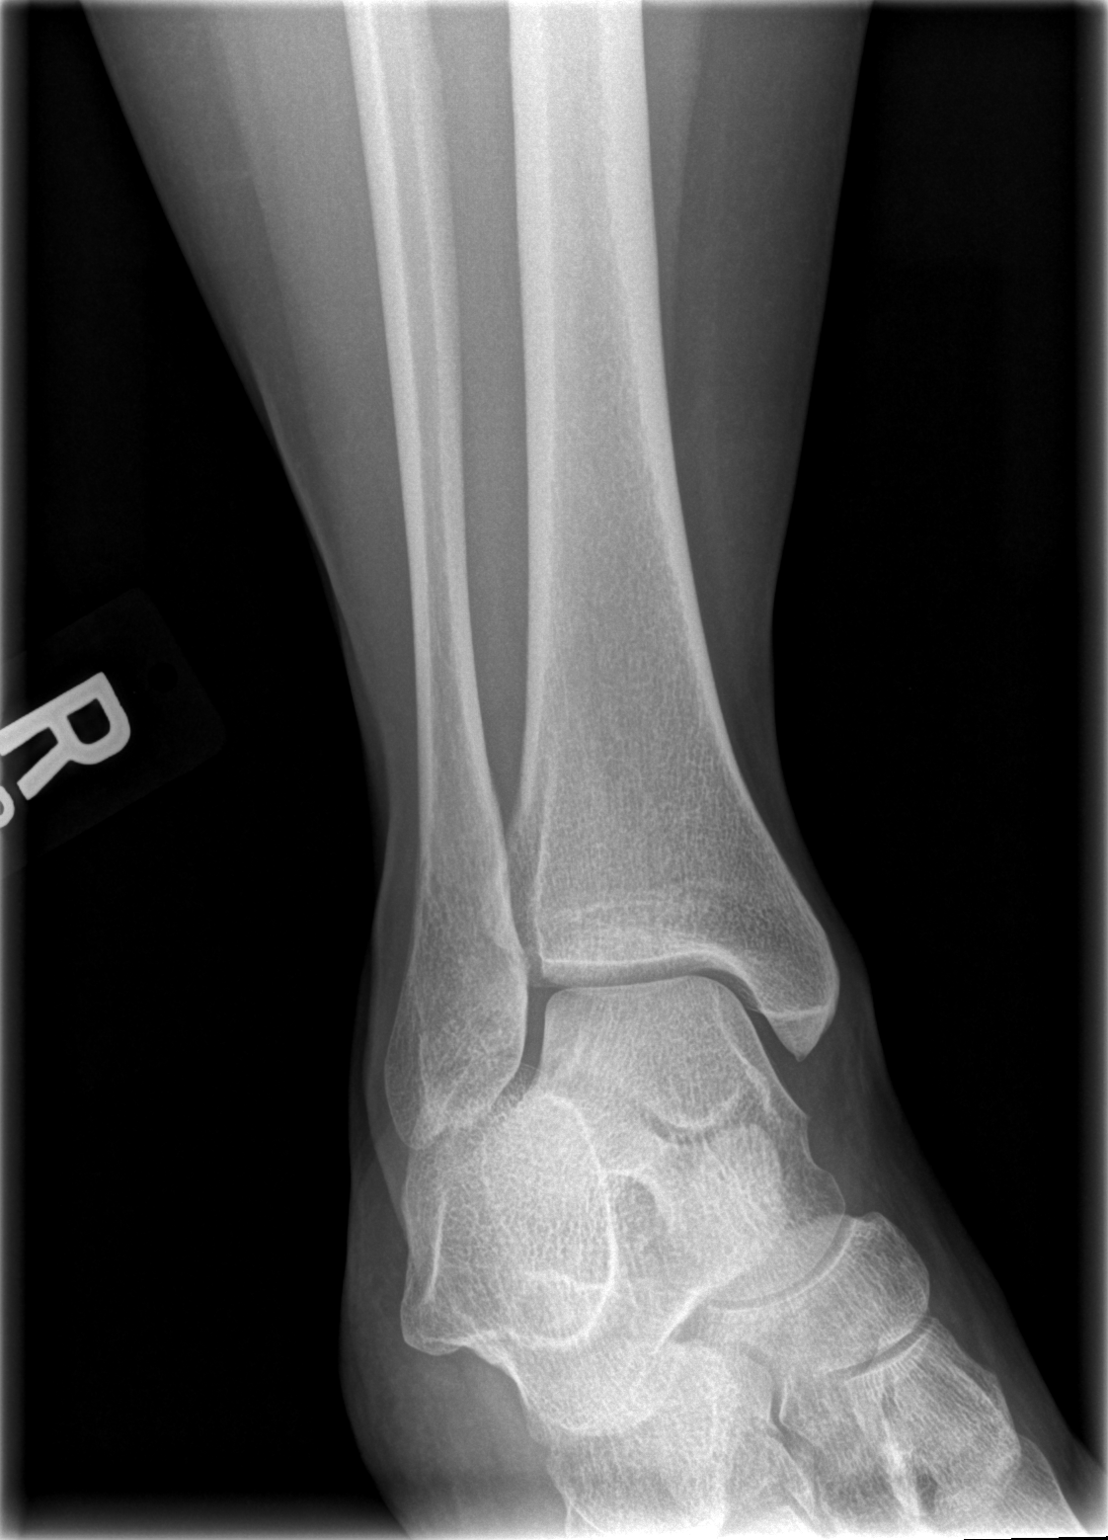

[t ankle joint lat right]
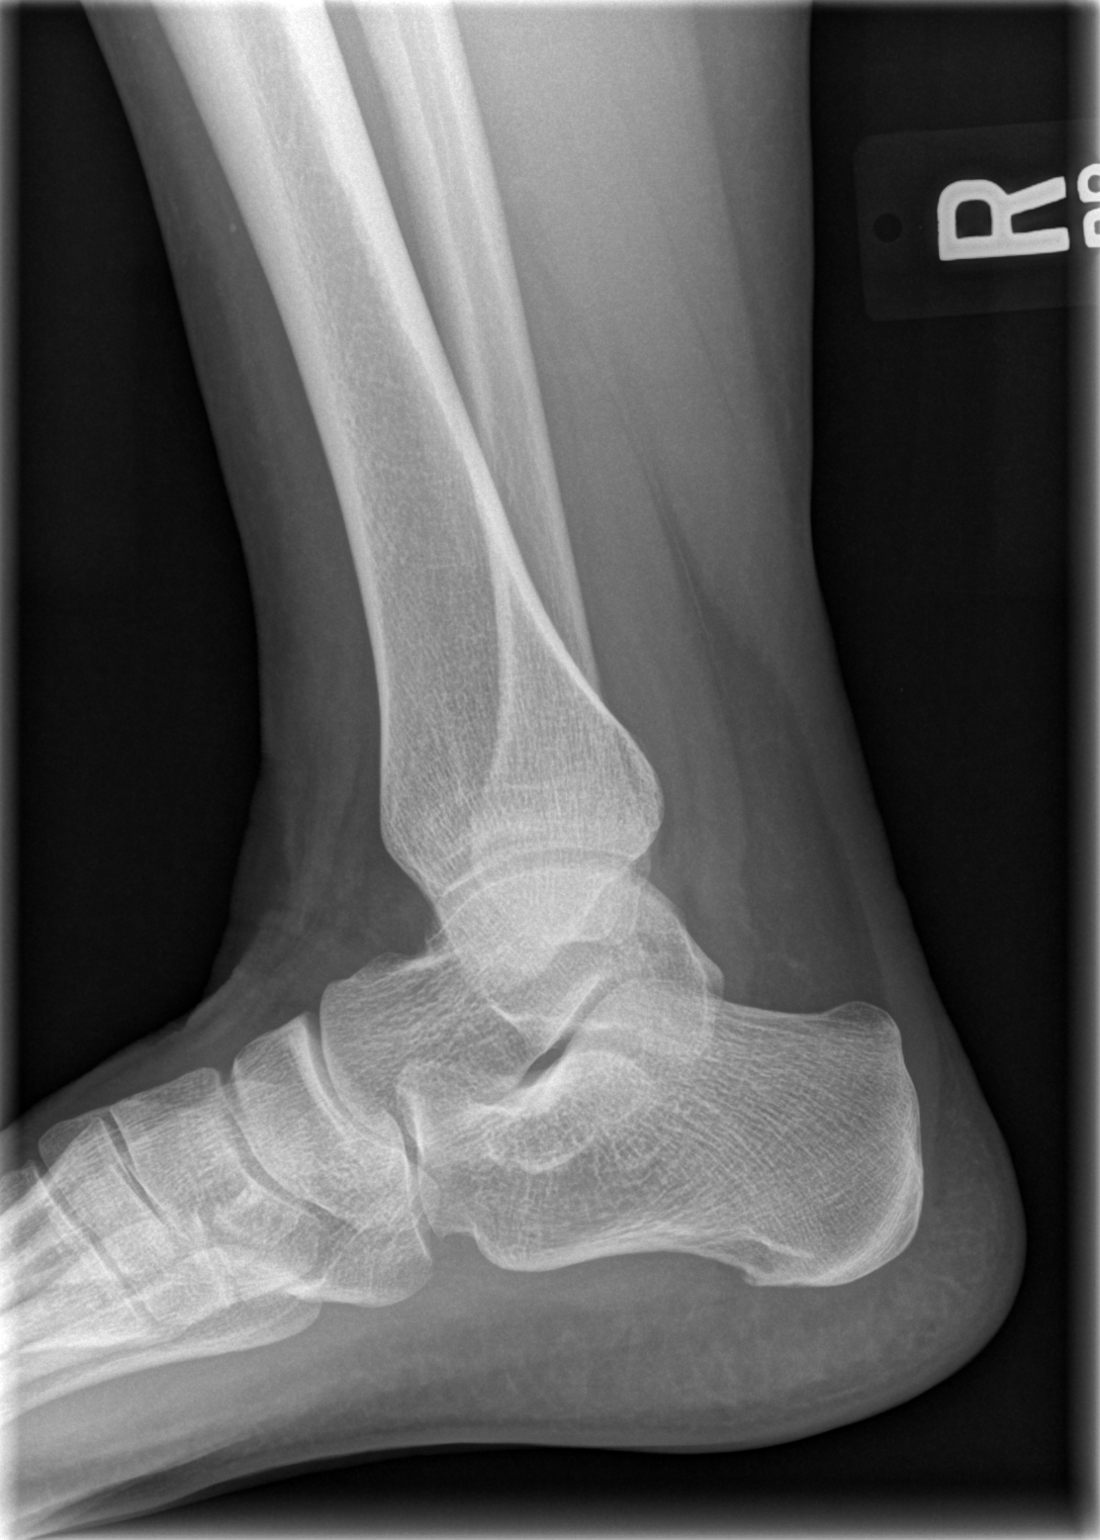

[3 of 3 positions shown; findings below may reference images not displayed]

FINDINGS: There is no evidence of fracture, dislocation, or joint effusion.
The ankle mortise is preserved. Tiny plantar calcaneal spur. There
is no evidence of arthropathy or other focal bone abnormality.
Intact talar dome. Soft tissues are unremarkable.
IMPRESSION: Tiny plantar calcaneal spur. Otherwise unremarkable radiographs of
the right ankle.

## 2023-06-07 IMAGING — CR DG KNEE 1-2V*R*
2 series · 2 of 2 positions shown · non-contrast
Comparison: None.

CLINICAL DATA: Knee pain for several years.

EXAM:
RIGHT KNEE - 1-2 VIEW

[t knee ap right]
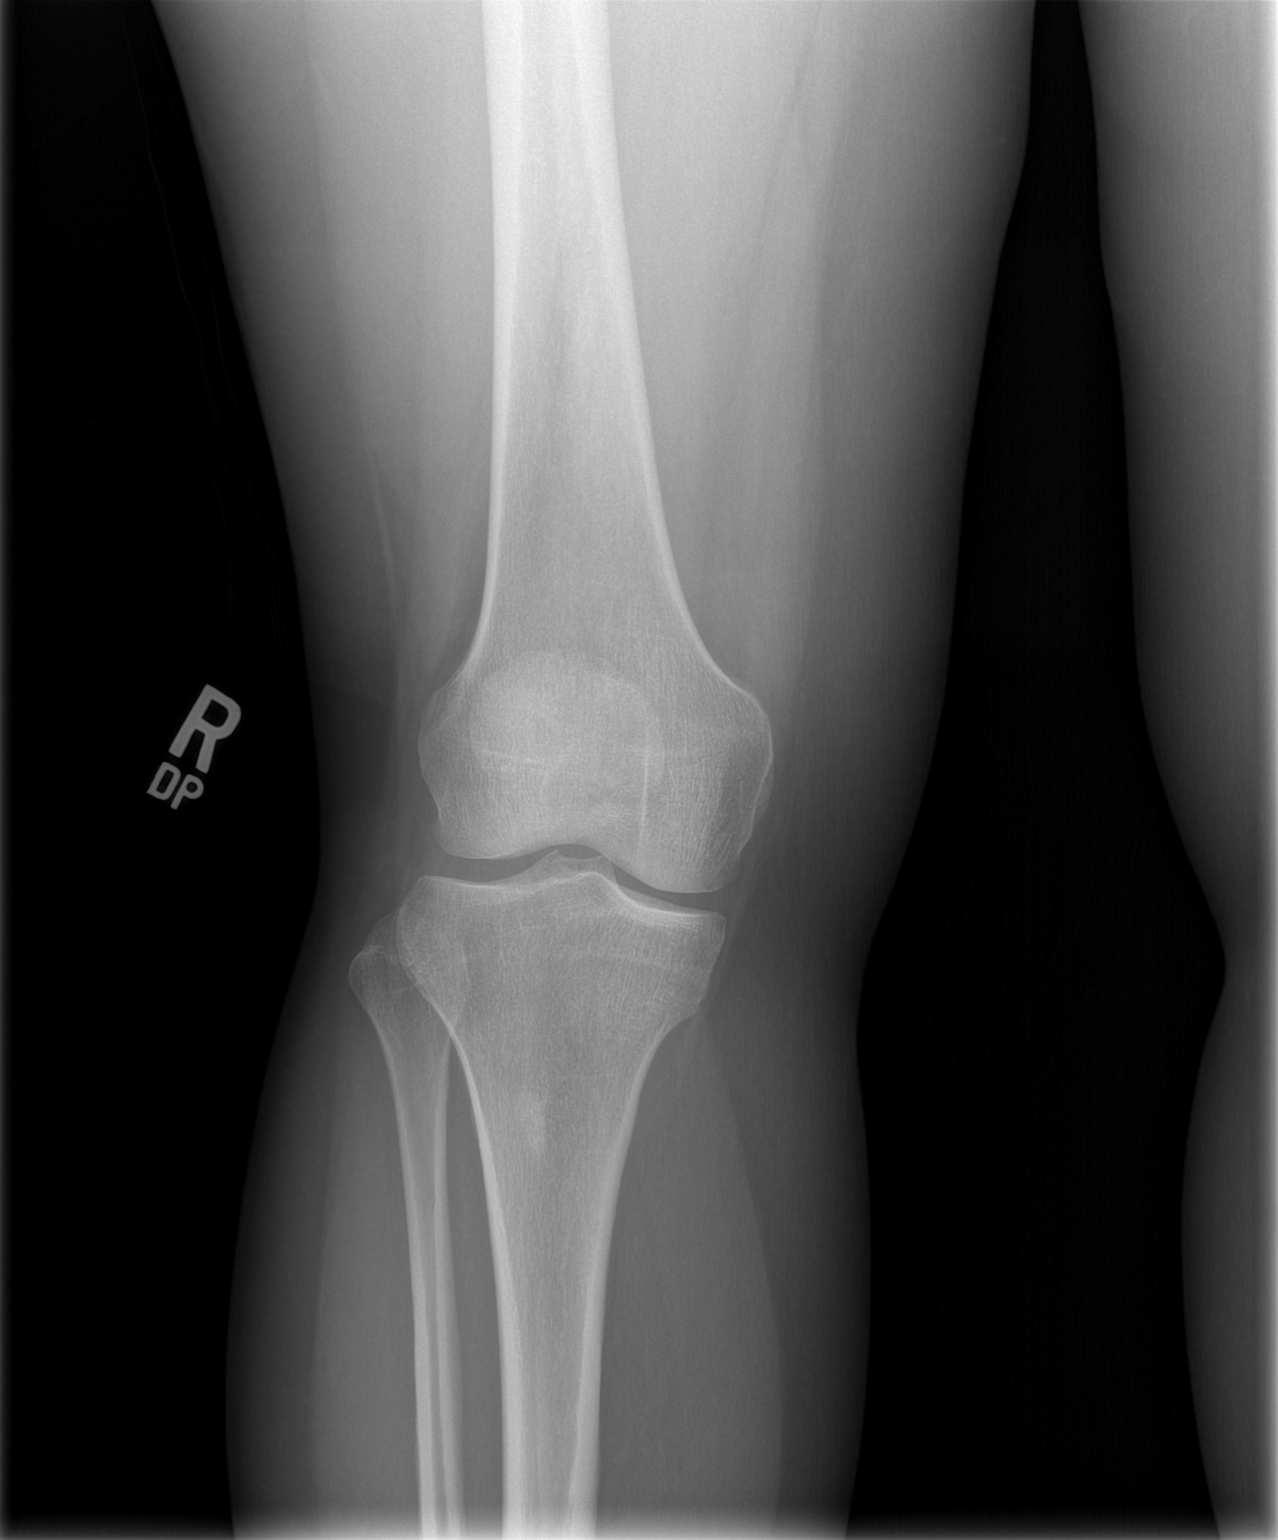

[t knee lat right]
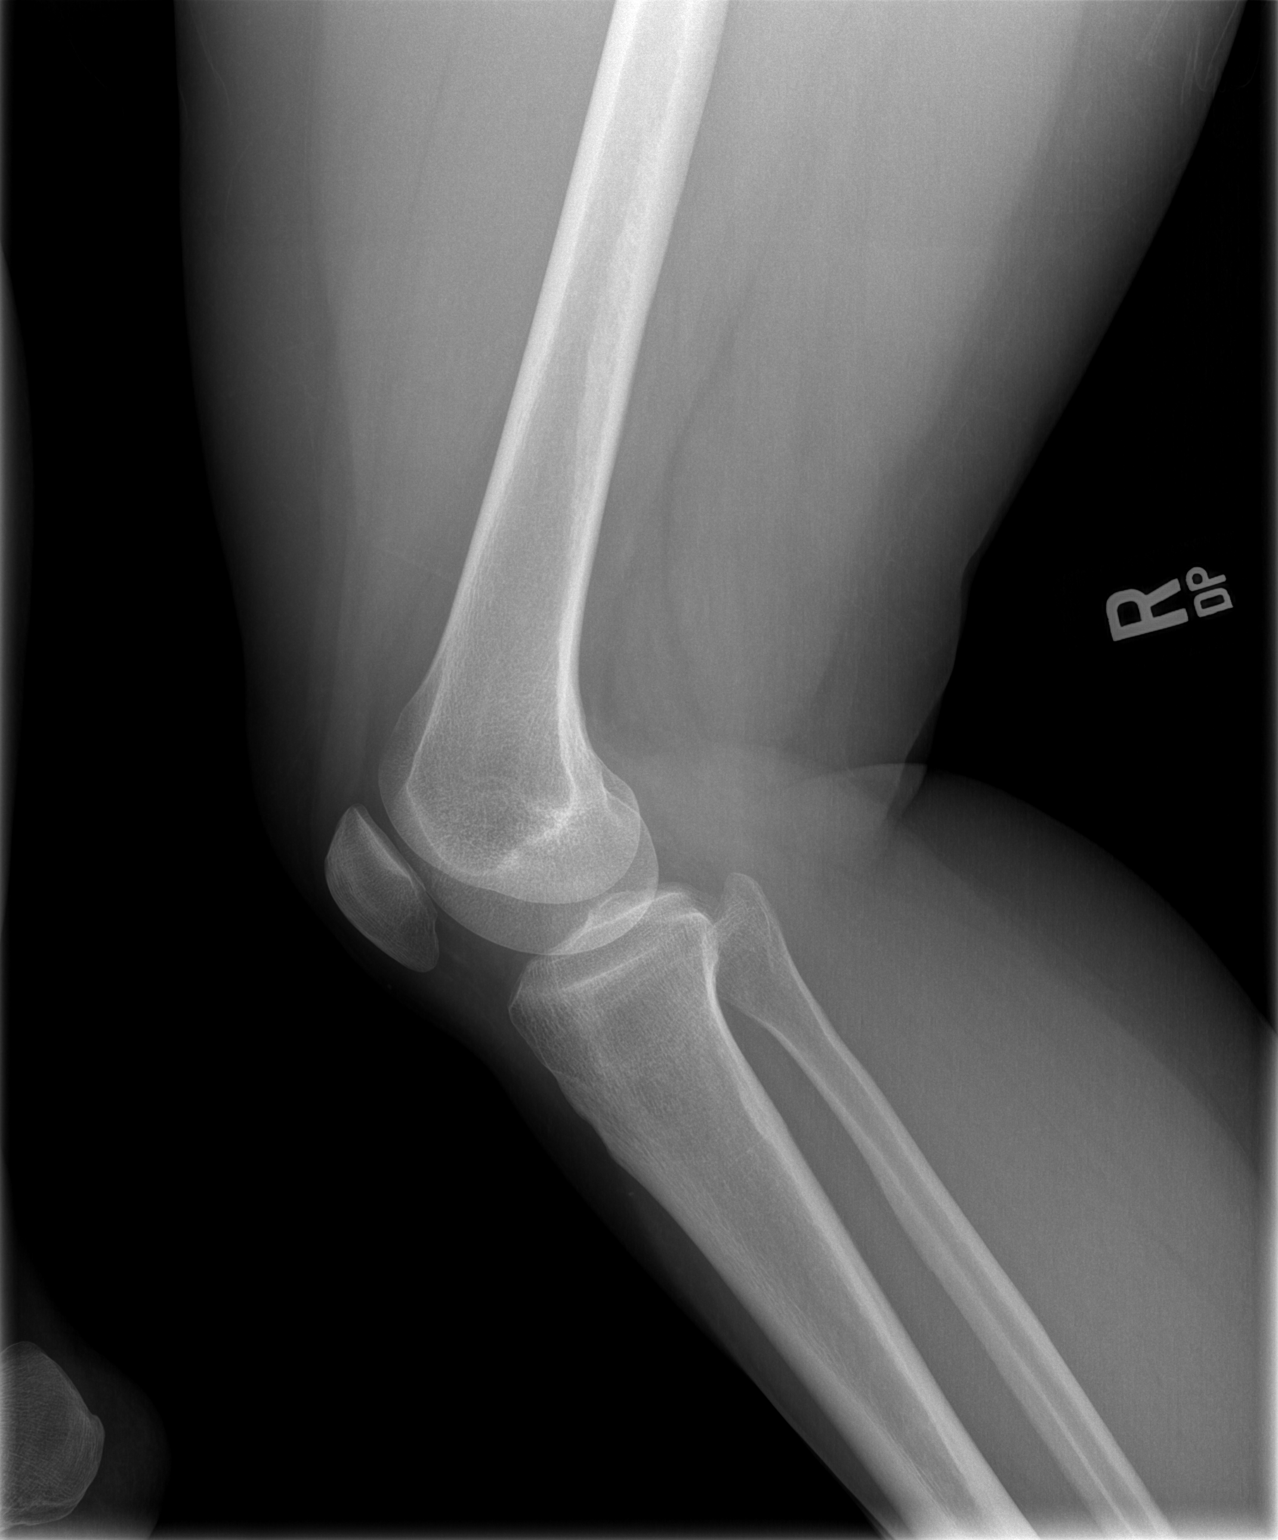

[2 of 2 positions shown; findings below may reference images not displayed]

FINDINGS: No evidence of fracture, dislocation, or joint effusion. Normal
joint spaces and alignment. No evidence of arthropathy or other
focal bone abnormality. Soft tissues are unremarkable.
IMPRESSION: Negative radiographs of the right knee.

## 2023-06-22 ENCOUNTER — Encounter: Payer: Self-pay | Admitting: Diagnostic Neuroimaging

## 2023-06-24 ENCOUNTER — Ambulatory Visit
Admission: RE | Admit: 2023-06-24 | Discharge: 2023-06-24 | Disposition: A | Discharge status: Home / Self Care | Payer: Commercial Managed Care - HMO | Source: Ambulatory Visit | Attending: Diagnostic Neuroimaging | Admitting: Diagnostic Neuroimaging

## 2023-06-24 DIAGNOSIS — H538 Other visual disturbances: Secondary | ICD-10-CM

## 2023-06-24 DIAGNOSIS — R29898 Other symptoms and signs involving the musculoskeletal system: Secondary | ICD-10-CM

## 2023-06-24 DIAGNOSIS — R269 Unspecified abnormalities of gait and mobility: Secondary | ICD-10-CM

## 2023-06-24 DIAGNOSIS — R413 Other amnesia: Secondary | ICD-10-CM

## 2023-06-24 DIAGNOSIS — R1310 Dysphagia, unspecified: Secondary | ICD-10-CM

## 2023-06-24 MED ORDER — GADOPICLENOL 0.5 MMOL/ML IV SOLN
8.0000 mL | Freq: Once | INTRAVENOUS | Status: AC | PRN
  Administered 2023-06-24: 8 mL via INTRAVENOUS
